# Patient Record
Sex: Female | Born: 1960 | Race: Black or African American | Hispanic: No | Smoking: Never smoker
Health system: Southern US, Community
[De-identification: ages and names within clinical notes are randomized; demographics above are authoritative.]

## PROBLEM LIST (undated history)

## (undated) DIAGNOSIS — Z8619 Personal history of other infectious and parasitic diseases: Secondary | ICD-10-CM

## (undated) DIAGNOSIS — IMO0002 Reserved for concepts with insufficient information to code with codable children: Secondary | ICD-10-CM

## (undated) DIAGNOSIS — K8020 Calculus of gallbladder without cholecystitis without obstruction: Secondary | ICD-10-CM

## (undated) DIAGNOSIS — G43909 Migraine, unspecified, not intractable, without status migrainosus: Secondary | ICD-10-CM

## (undated) DIAGNOSIS — I341 Nonrheumatic mitral (valve) prolapse: Secondary | ICD-10-CM

## (undated) DIAGNOSIS — N979 Female infertility, unspecified: Secondary | ICD-10-CM

## (undated) DIAGNOSIS — R011 Cardiac murmur, unspecified: Secondary | ICD-10-CM

## (undated) DIAGNOSIS — Z87440 Personal history of urinary (tract) infections: Secondary | ICD-10-CM

## (undated) DIAGNOSIS — Z9889 Other specified postprocedural states: Secondary | ICD-10-CM

## (undated) DIAGNOSIS — R932 Abnormal findings on diagnostic imaging of liver and biliary tract: Secondary | ICD-10-CM

## (undated) DIAGNOSIS — O09529 Supervision of elderly multigravida, unspecified trimester: Secondary | ICD-10-CM

## (undated) DIAGNOSIS — R51 Headache: Secondary | ICD-10-CM

## (undated) DIAGNOSIS — D649 Anemia, unspecified: Secondary | ICD-10-CM

## (undated) DIAGNOSIS — N83209 Unspecified ovarian cyst, unspecified side: Secondary | ICD-10-CM

## (undated) DIAGNOSIS — Z8719 Personal history of other diseases of the digestive system: Secondary | ICD-10-CM

## (undated) HISTORY — DX: Calculus of gallbladder without cholecystitis without obstruction: K80.20

## (undated) HISTORY — PX: WISDOM TOOTH EXTRACTION: SHX21

## (undated) HISTORY — DX: Abnormal findings on diagnostic imaging of liver and biliary tract: R93.2

## (undated) HISTORY — DX: Migraine, unspecified, not intractable, without status migrainosus: G43.909

## (undated) HISTORY — DX: Cardiac murmur, unspecified: R01.1

## (undated) HISTORY — DX: Personal history of other diseases of the digestive system: Z87.19

## (undated) HISTORY — DX: Reserved for concepts with insufficient information to code with codable children: IMO0002

## (undated) HISTORY — PX: LAPAROSCOPIC ABLATION RENAL MASS: SUR751

## (undated) HISTORY — DX: Female infertility, unspecified: N97.9

## (undated) HISTORY — DX: Personal history of other infectious and parasitic diseases: Z86.19

## (undated) HISTORY — DX: Nonrheumatic mitral (valve) prolapse: I34.1

## (undated) HISTORY — DX: Unspecified ovarian cyst, unspecified side: N83.209

## (undated) HISTORY — DX: Personal history of urinary (tract) infections: Z87.440

## (undated) HISTORY — DX: Anemia, unspecified: D64.9

## (undated) HISTORY — PX: LAPAROSCOPIC OVARIAN CYSTECTOMY: SUR786

## (undated) HISTORY — DX: Supervision of elderly multigravida, unspecified trimester: O09.529

## (undated) HISTORY — DX: Headache: R51

---

## 1998-06-11 DIAGNOSIS — R87619 Unspecified abnormal cytological findings in specimens from cervix uteri: Secondary | ICD-10-CM | POA: Insufficient documentation

## 1999-06-12 DIAGNOSIS — Z8719 Personal history of other diseases of the digestive system: Secondary | ICD-10-CM

## 1999-06-12 DIAGNOSIS — R87619 Unspecified abnormal cytological findings in specimens from cervix uteri: Secondary | ICD-10-CM

## 1999-06-12 DIAGNOSIS — IMO0002 Reserved for concepts with insufficient information to code with codable children: Secondary | ICD-10-CM

## 1999-06-12 HISTORY — DX: Reserved for concepts with insufficient information to code with codable children: IMO0002

## 1999-06-12 HISTORY — DX: Personal history of other diseases of the digestive system: Z87.19

## 1999-06-12 HISTORY — DX: Unspecified abnormal cytological findings in specimens from cervix uteri: R87.619

## 2003-04-12 DIAGNOSIS — Z87440 Personal history of urinary (tract) infections: Secondary | ICD-10-CM

## 2003-04-12 HISTORY — DX: Personal history of urinary (tract) infections: Z87.440

## 2003-06-28 ENCOUNTER — Emergency Department (HOSPITAL_COMMUNITY): Admission: EM | Admit: 2003-06-28 | Discharge: 2003-06-28 | Payer: Self-pay | Admitting: *Deleted

## 2003-07-03 ENCOUNTER — Emergency Department (HOSPITAL_COMMUNITY): Admission: EM | Admit: 2003-07-03 | Discharge: 2003-07-03 | Payer: Self-pay | Admitting: Podiatry

## 2004-04-17 ENCOUNTER — Ambulatory Visit: Payer: Self-pay | Admitting: Internal Medicine

## 2004-04-18 ENCOUNTER — Ambulatory Visit: Payer: Self-pay

## 2004-08-04 ENCOUNTER — Other Ambulatory Visit: Admission: RE | Admit: 2004-08-04 | Discharge: 2004-08-04 | Payer: Self-pay | Admitting: Obstetrics and Gynecology

## 2004-10-02 ENCOUNTER — Ambulatory Visit (HOSPITAL_COMMUNITY): Admission: RE | Admit: 2004-10-02 | Discharge: 2004-10-02 | Payer: Self-pay | Admitting: Obstetrics and Gynecology

## 2005-02-22 ENCOUNTER — Ambulatory Visit (HOSPITAL_COMMUNITY): Admission: RE | Admit: 2005-02-22 | Discharge: 2005-02-22 | Payer: Self-pay | Admitting: Obstetrics and Gynecology

## 2005-02-27 ENCOUNTER — Inpatient Hospital Stay (HOSPITAL_COMMUNITY): Admission: AD | Admit: 2005-02-27 | Discharge: 2005-03-02 | Payer: Self-pay | Admitting: Obstetrics and Gynecology

## 2005-02-27 ENCOUNTER — Encounter (INDEPENDENT_AMBULATORY_CARE_PROVIDER_SITE_OTHER): Payer: Self-pay | Admitting: Specialist

## 2005-05-23 ENCOUNTER — Ambulatory Visit: Payer: Self-pay | Admitting: Internal Medicine

## 2005-06-22 ENCOUNTER — Ambulatory Visit: Payer: Self-pay | Admitting: Family Medicine

## 2005-07-05 ENCOUNTER — Ambulatory Visit: Payer: Self-pay | Admitting: Internal Medicine

## 2005-07-31 ENCOUNTER — Ambulatory Visit: Payer: Self-pay | Admitting: Internal Medicine

## 2005-10-08 ENCOUNTER — Ambulatory Visit: Payer: Self-pay | Admitting: Internal Medicine

## 2005-10-23 ENCOUNTER — Emergency Department (HOSPITAL_COMMUNITY): Admission: EM | Admit: 2005-10-23 | Discharge: 2005-10-23 | Payer: Self-pay | Admitting: *Deleted

## 2006-02-25 ENCOUNTER — Other Ambulatory Visit: Admission: RE | Admit: 2006-02-25 | Discharge: 2006-02-25 | Payer: Self-pay | Admitting: Obstetrics and Gynecology

## 2006-03-11 ENCOUNTER — Encounter: Admission: RE | Admit: 2006-03-11 | Discharge: 2006-03-11 | Payer: Self-pay | Admitting: Obstetrics and Gynecology

## 2006-03-18 ENCOUNTER — Encounter: Admission: RE | Admit: 2006-03-18 | Discharge: 2006-03-18 | Payer: Self-pay | Admitting: Obstetrics and Gynecology

## 2006-06-11 DIAGNOSIS — IMO0002 Reserved for concepts with insufficient information to code with codable children: Secondary | ICD-10-CM

## 2006-06-11 HISTORY — DX: Reserved for concepts with insufficient information to code with codable children: IMO0002

## 2006-09-08 ENCOUNTER — Emergency Department (HOSPITAL_COMMUNITY): Admission: EM | Admit: 2006-09-08 | Discharge: 2006-09-08 | Payer: Self-pay | Admitting: Family Medicine

## 2006-09-10 ENCOUNTER — Emergency Department (HOSPITAL_COMMUNITY): Admission: EM | Admit: 2006-09-10 | Discharge: 2006-09-10 | Payer: Self-pay | Admitting: Emergency Medicine

## 2006-09-10 ENCOUNTER — Ambulatory Visit: Payer: Self-pay | Admitting: Internal Medicine

## 2006-09-18 ENCOUNTER — Ambulatory Visit: Payer: Self-pay | Admitting: Internal Medicine

## 2006-09-18 LAB — CONVERTED CEMR LAB
ALT: 14 units/L (ref 0–40)
AST: 15 units/L (ref 0–37)
Albumin: 3.7 g/dL (ref 3.5–5.2)
Basophils Absolute: 0 10*3/uL (ref 0.0–0.1)
Chloride: 104 meq/L (ref 96–112)
Creatinine, Ser: 0.5 mg/dL (ref 0.4–1.2)
Eosinophils Relative: 1.5 % (ref 0.0–5.0)
HCT: 35 % — ABNORMAL LOW (ref 36.0–46.0)
MCHC: 35.1 g/dL (ref 30.0–36.0)
Neutrophils Relative %: 51.4 % (ref 43.0–77.0)
RBC: 3.85 M/uL — ABNORMAL LOW (ref 3.87–5.11)
RDW: 13.9 % (ref 11.5–14.6)
Sodium: 139 meq/L (ref 135–145)
Total Bilirubin: 0.5 mg/dL (ref 0.3–1.2)
WBC: 5.3 10*3/uL (ref 4.5–10.5)

## 2006-12-17 ENCOUNTER — Ambulatory Visit (HOSPITAL_COMMUNITY): Admission: RE | Admit: 2006-12-17 | Discharge: 2006-12-17 | Payer: Self-pay | Admitting: Obstetrics and Gynecology

## 2006-12-18 ENCOUNTER — Ambulatory Visit: Payer: Self-pay | Admitting: Internal Medicine

## 2006-12-18 ENCOUNTER — Encounter: Payer: Self-pay | Admitting: Internal Medicine

## 2006-12-18 DIAGNOSIS — Z8669 Personal history of other diseases of the nervous system and sense organs: Secondary | ICD-10-CM

## 2006-12-18 DIAGNOSIS — R51 Headache: Secondary | ICD-10-CM

## 2006-12-18 HISTORY — DX: Headache: R51

## 2007-03-13 ENCOUNTER — Ambulatory Visit (HOSPITAL_COMMUNITY): Admission: RE | Admit: 2007-03-13 | Discharge: 2007-03-13 | Payer: Self-pay | Admitting: Obstetrics and Gynecology

## 2007-04-03 ENCOUNTER — Emergency Department (HOSPITAL_COMMUNITY): Admission: EM | Admit: 2007-04-03 | Discharge: 2007-04-03 | Payer: Self-pay | Admitting: Family Medicine

## 2007-08-05 ENCOUNTER — Encounter (INDEPENDENT_AMBULATORY_CARE_PROVIDER_SITE_OTHER): Payer: Self-pay | Admitting: Obstetrics and Gynecology

## 2007-08-05 ENCOUNTER — Inpatient Hospital Stay (HOSPITAL_COMMUNITY): Admission: RE | Admit: 2007-08-05 | Discharge: 2007-08-09 | Payer: Self-pay | Admitting: Obstetrics and Gynecology

## 2008-01-29 ENCOUNTER — Emergency Department (HOSPITAL_BASED_OUTPATIENT_CLINIC_OR_DEPARTMENT_OTHER): Admission: EM | Admit: 2008-01-29 | Discharge: 2008-01-29 | Payer: Self-pay | Admitting: Emergency Medicine

## 2008-02-02 ENCOUNTER — Ambulatory Visit: Payer: Self-pay | Admitting: Gastroenterology

## 2008-02-02 DIAGNOSIS — R1013 Epigastric pain: Secondary | ICD-10-CM

## 2008-02-02 DIAGNOSIS — R932 Abnormal findings on diagnostic imaging of liver and biliary tract: Secondary | ICD-10-CM | POA: Insufficient documentation

## 2008-02-02 HISTORY — DX: Abnormal findings on diagnostic imaging of liver and biliary tract: R93.2

## 2008-02-03 LAB — CONVERTED CEMR LAB
AST: 23 units/L (ref 0–37)
Alkaline Phosphatase: 95 units/L (ref 39–117)
Basophils Absolute: 0.1 10*3/uL (ref 0.0–0.1)
Bilirubin, Direct: 0.1 mg/dL (ref 0.0–0.3)
Chloride: 107 meq/L (ref 96–112)
Eosinophils Absolute: 0.1 10*3/uL (ref 0.0–0.7)
Eosinophils Relative: 1.3 % (ref 0.0–5.0)
GFR calc Af Amer: 117 mL/min
GFR calc non Af Amer: 97 mL/min
MCHC: 34.1 g/dL (ref 30.0–36.0)
MCV: 93.2 fL (ref 78.0–100.0)
Neutrophils Relative %: 57 % (ref 43.0–77.0)
Platelets: 340 10*3/uL (ref 150–400)
Potassium: 3.4 meq/L — ABNORMAL LOW (ref 3.5–5.1)
RDW: 14 % (ref 11.5–14.6)
Sodium: 143 meq/L (ref 135–145)
Total Bilirubin: 0.6 mg/dL (ref 0.3–1.2)
WBC: 6.2 10*3/uL (ref 4.5–10.5)

## 2008-04-16 ENCOUNTER — Emergency Department (HOSPITAL_BASED_OUTPATIENT_CLINIC_OR_DEPARTMENT_OTHER): Admission: EM | Admit: 2008-04-16 | Discharge: 2008-04-16 | Payer: Self-pay | Admitting: Emergency Medicine

## 2009-05-31 ENCOUNTER — Ambulatory Visit: Payer: Self-pay | Admitting: Internal Medicine

## 2009-06-13 ENCOUNTER — Ambulatory Visit: Payer: Self-pay | Admitting: Internal Medicine

## 2009-06-13 LAB — CONVERTED CEMR LAB
Basophils Absolute: 0.1 10*3/uL (ref 0.0–0.1)
Cholesterol: 164 mg/dL (ref 0–200)
Eosinophils Relative: 2.3 % (ref 0.0–5.0)
HCT: 33.8 % — ABNORMAL LOW (ref 36.0–46.0)
Hemoglobin: 11.3 g/dL — ABNORMAL LOW (ref 12.0–15.0)
Lymphs Abs: 1.9 10*3/uL (ref 0.7–4.0)
MCV: 94.8 fL (ref 78.0–100.0)
Monocytes Absolute: 0.5 10*3/uL (ref 0.1–1.0)
Neutro Abs: 2.8 10*3/uL (ref 1.4–7.7)
Platelets: 248 10*3/uL (ref 150.0–400.0)
RDW: 14.3 % (ref 11.5–14.6)

## 2009-06-20 ENCOUNTER — Ambulatory Visit: Payer: Self-pay | Admitting: Internal Medicine

## 2009-06-23 ENCOUNTER — Ambulatory Visit: Payer: Self-pay | Admitting: Internal Medicine

## 2009-07-27 ENCOUNTER — Ambulatory Visit: Payer: Self-pay | Admitting: Family Medicine

## 2009-07-30 ENCOUNTER — Ambulatory Visit: Payer: Self-pay | Admitting: Diagnostic Radiology

## 2009-07-30 ENCOUNTER — Emergency Department (HOSPITAL_BASED_OUTPATIENT_CLINIC_OR_DEPARTMENT_OTHER): Admission: EM | Admit: 2009-07-30 | Discharge: 2009-07-30 | Payer: Self-pay | Admitting: Emergency Medicine

## 2009-09-04 ENCOUNTER — Emergency Department (HOSPITAL_BASED_OUTPATIENT_CLINIC_OR_DEPARTMENT_OTHER): Admission: EM | Admit: 2009-09-04 | Discharge: 2009-09-04 | Payer: Self-pay | Admitting: Emergency Medicine

## 2009-09-12 ENCOUNTER — Emergency Department (HOSPITAL_BASED_OUTPATIENT_CLINIC_OR_DEPARTMENT_OTHER): Admission: EM | Admit: 2009-09-12 | Discharge: 2009-09-13 | Payer: Self-pay | Admitting: Emergency Medicine

## 2009-11-09 ENCOUNTER — Encounter: Admission: RE | Admit: 2009-11-09 | Discharge: 2009-11-09 | Payer: Self-pay | Admitting: Obstetrics and Gynecology

## 2010-03-20 ENCOUNTER — Emergency Department (HOSPITAL_BASED_OUTPATIENT_CLINIC_OR_DEPARTMENT_OTHER): Admission: EM | Admit: 2010-03-20 | Discharge: 2010-03-20 | Payer: Self-pay | Admitting: Emergency Medicine

## 2010-03-20 ENCOUNTER — Encounter: Payer: Self-pay | Admitting: Cardiovascular Disease

## 2010-03-20 ENCOUNTER — Ambulatory Visit: Payer: Self-pay | Admitting: Diagnostic Radiology

## 2010-03-21 ENCOUNTER — Telehealth: Payer: Self-pay | Admitting: Internal Medicine

## 2010-03-21 DIAGNOSIS — R079 Chest pain, unspecified: Secondary | ICD-10-CM | POA: Insufficient documentation

## 2010-03-23 DIAGNOSIS — K8020 Calculus of gallbladder without cholecystitis without obstruction: Secondary | ICD-10-CM

## 2010-03-23 DIAGNOSIS — D649 Anemia, unspecified: Secondary | ICD-10-CM

## 2010-03-23 DIAGNOSIS — N83209 Unspecified ovarian cyst, unspecified side: Secondary | ICD-10-CM

## 2010-03-23 HISTORY — DX: Anemia, unspecified: D64.9

## 2010-03-23 HISTORY — DX: Calculus of gallbladder without cholecystitis without obstruction: K80.20

## 2010-03-23 HISTORY — DX: Unspecified ovarian cyst, unspecified side: N83.209

## 2010-03-24 ENCOUNTER — Ambulatory Visit: Payer: Self-pay | Admitting: Cardiology

## 2010-04-07 ENCOUNTER — Ambulatory Visit (HOSPITAL_COMMUNITY): Admission: RE | Admit: 2010-04-07 | Discharge: 2010-04-07 | Payer: Self-pay | Admitting: Cardiology

## 2010-04-07 ENCOUNTER — Encounter: Payer: Self-pay | Admitting: Cardiology

## 2010-04-07 ENCOUNTER — Ambulatory Visit: Payer: Self-pay | Admitting: Internal Medicine

## 2010-04-07 ENCOUNTER — Ambulatory Visit: Payer: Self-pay

## 2010-07-01 ENCOUNTER — Encounter: Payer: Self-pay | Admitting: Obstetrics and Gynecology

## 2010-07-02 ENCOUNTER — Encounter: Payer: Self-pay | Admitting: Obstetrics and Gynecology

## 2010-07-03 ENCOUNTER — Encounter: Payer: Self-pay | Admitting: Obstetrics and Gynecology

## 2010-07-11 NOTE — Assessment & Plan Note (Signed)
Summary: COUGH, CONGESTION // RS//PT [facility] WANTED LATER APPT/CJR   Vital Signs:  Patient profile:   50 year old female Temp:     99.8 degrees F oral BP sitting:   100 / 70  (left arm) Cuff size:   regular  Vitals Entered By: Sid Falcon LPN (July 27, 2009 2:21 PM) CC: Cough, congestion, body aches, chills X 2-3 days      History of Present Illness: Acute visit. Onset 3 days ago flulike illness with dry cough, body aches, chills and fever. Sore throat. Denies any nausea, vomiting, or diarrhea. TheraFlu without much relief. 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  Allergies: 1)  ! Pcn 2)  ! Imitrex 3)  ! * Latex  Past History:  Past Medical History: Last updated: 06/13/2009 Anemia GERD Headache PMH reviewed for relevance  Review of Systems      See HPI  Physical Exam  General:  Well-developed,well-nourished,in no acute distress; alert,appropriate and cooperative throughout examination Ears:  External ear exam shows no significant lesions or deformities.  Otoscopic examination reveals clear canals, tympanic membranes are intact bilaterally without bulging, retraction, inflammation or discharge. Hearing is grossly normal bilaterally. Mouth:  Oral mucosa and oropharynx without lesions or exudates.  Teeth in good repair. Neck:  No deformities, masses, or tenderness noted. Lungs:  Normal respiratory effort, chest expands symmetrically. Lungs are clear to auscultation, no crackles or wheezes. Heart:  Normal rate and regular rhythm. S1 and S2 normal without gallop, murmur, click, rub or other extra sounds. Skin:  no rash. Cervical Nodes:  No lymphadenopathy noted   Impression & Recommendations:  Problem # 1:  VIRAL INFECTION (ICD-079.99)  Her updated medication list for this problem includes:    Hydrocodone-homatropine 5-1.5 Mg/35ml Syrp (Hydrocodone-homatropine) ..... One tsp by mouth q 4-6 hours as needed cough  Complete Medication List: 1)   Hydrocodone-homatropine 5-1.5 Mg/80ml Syrp (Hydrocodone-homatropine) .... One tsp by mouth q 4-6 hours as needed cough  Patient Instructions: 1)  Get plenty of rest, drink lots of clear liquids, and use Tylenol or Ibuprofen for fever and comfort. Return in 7-10 days if you're not better: sooner if you'er feeling worse.  Prescriptions: HYDROCODONE-HOMATROPINE 5-1.5 MG/5ML SYRP (HYDROCODONE-HOMATROPINE) one tsp by mouth q 4-6 hours as needed cough  #120 ml x 0   Entered and Authorized by:   Evelena Peat MD   Signed by:   Evelena Peat MD on 07/27/2009   Method used:   Print then Give to Patient   RxID:   [id number]

## 2010-07-11 NOTE — Assessment & Plan Note (Signed)
Summary: cpx/pt coming in fasting/cjr   Vital Signs:  Patient profile:   50 year old female Weight:      162 pounds BMI:     31.75 BP sitting:   102 / 70  (left arm) Cuff size:   regular  Vitals Entered By: [name], RN (June 13, 2009 1:33 PM) CC: CPX, sees gyn. C/o headaches, neck and back pain. Is Patient Diabetic? No   Primary Care Provider:  Eleonore Cox, [name]  CC:  CPX, sees gyn. C/o headaches, and neck and back pain.[name]  History of Present Illness: 50 year old patient who is seen today for a comprehensive evaluation.  She is followed annually by gynecology for main complaint is chronic daily headaches.  These began approximately 2 years ago postpartum and she describes severe headaches.  These are a severe pressure sensation that occur in both the frontal and occipital areas. These  are associated  with nausea and some dizziness, but no light sensitivity.  She states that her 10 years ago, she was felt to have migraine headaches that  were associated with a visual aura.  She has been taking anti-inflammatory medications with no significant benefit  Allergies: 1)  ! Pcn 2)  ! Imitrex 3)  ! * Latex  Past History:  Past Medical History: Anemia GERD Headache  Past Surgical History: hernia surgery G2P2A0  Family History: Reviewed history from 02/02/2008 and no changes required. no gallbladder disease in family  father:  25 DJD,  mother: 55 Htn   one sister- scoliosis  Social History: Reviewed history from 02/02/2008 and no changes required. married, 2 children, works as a Futures trader, nonsmoker, her son's are age 11 and 6 months.  Review of Systems       The patient complains of headaches.  The patient denies anorexia, fever, weight loss, weight gain, vision loss, decreased hearing, hoarseness, chest pain, syncope, dyspnea on exertion, peripheral edema, prolonged cough, hemoptysis, abdominal pain, melena, hematochezia, severe indigestion/heartburn,  hematuria, incontinence, genital sores, muscle weakness, suspicious skin lesions, transient blindness, difficulty walking, depression, unusual weight change, abnormal bleeding, enlarged lymph nodes, angioedema, and breast masses.    Physical Exam  General:  overweight-appearing.  low-normal blood pressureoverweight-appearing.   Head:  Normocephalic and atraumatic without obvious abnormalities. No apparent alopecia or balding. Eyes:  No corneal or conjunctival inflammation noted. EOMI. Perrla. Funduscopic exam benign, without hemorrhages, exudates or papilledema. Vision grossly normal. Ears:  External ear exam shows no significant lesions or deformities.  Otoscopic examination reveals clear canals, tympanic membranes are intact bilaterally without bulging, retraction, inflammation or discharge. Hearing is grossly normal bilaterally. Nose:  External nasal examination shows no deformity or inflammation. Nasal mucosa are pink and moist without lesions or exudates. Mouth:  Oral mucosa and oropharynx without lesions or exudates.  Teeth in good repair. Neck:  No deformities, masses, or tenderness noted. Chest Wall:  No deformities, masses, or tenderness noted. Breasts:  No mass, nodules, thickening, tenderness, bulging, retraction, inflamation, nipple discharge or skin changes noted.   Lungs:  Normal respiratory effort, chest expands symmetrically. Lungs are clear to auscultation, no crackles or wheezes. Heart:  Normal rate and regular rhythm. S1 and S2 normal without gallop, murmur, click, rub or other extra sounds. Abdomen:  Bowel sounds positive,abdomen soft and non-tender without masses, organomegaly or hernias noted. Msk:  No deformity or scoliosis noted of thoracic or lumbar spine.   Pulses:  R and L carotid,radial,femoral,dorsalis pedis and posterior tibial pulses are full and equal  bilaterally Extremities:  No clubbing, cyanosis, edema, or deformity noted with normal full range of motion of all  joints.   Neurologic:  No cranial nerve deficits noted. Station and gait are normal. Plantar reflexes are down-going bilaterally. DTRs are symmetrical throughout. Sensory, motor and coordinative functions appear intact. Skin:  Intact without suspicious lesions or rashes Cervical Nodes:  No lymphadenopathy noted Axillary Nodes:  No palpable lymphadenopathy Inguinal Nodes:  No significant adenopathy Psych:  Cognition and judgment appear intact. Alert and cooperative with normal attention span and concentration. No apparent delusions, illusions, hallucinations   Impression & Recommendations:  Problem # 1:  HEADACHE (ICD-784.0)  Other Orders: Venipuncture (44010) TLB-CBC Platelet - w/Differential (85025-CBCD) TLB-Cholesterol, Total (82465-CHO) Headache Clinic Referral (Headache)  Patient Instructions: 1)  Please schedule a follow-up appointment in 1 year. 2)  It is important that you exercise regularly at least 20 minutes 5 times a week. If you develop chest pain, have severe difficulty breathing, or feel very tired , stop exercising immediately and seek medical attention. 3)  headache clinic referral

## 2010-07-11 NOTE — Progress Notes (Signed)
Summary: Cardio Referral  Phone Note Call from Patient Call back at Home Phone [phone]   Caller: Patient Summary of Call: Patient called this morning stating that she went to the [facility] ER in [facility] last night for chest pain. She is wanting a referral to see a cardiologist. I offered her a appointment to talk about this with you but she said she sees no reason bc all she needs is the referral. She said she had an EKG and echo done in the ER. She would like for you to review her records from last night and let her know what you think. Please advise.  Initial call taken by: Harold Barban,  March 21, 2010 9:11 AM  Follow-up for Phone Call        do I need to try and get this info today?  Follow-up by: Duard Brady LPN,  March 21, 2010 10:02 AM  Additional Follow-up for Phone Call Additional follow up Details #1::        OK to refer to cardiology Additional Follow-up by: Gordy Savers  MD,  March 21, 2010 12:45 PM  New Problems: CHEST PAIN UNSPECIFIED (ICD-786.50)   Additional Follow-up for Phone Call Additional follow up Details #2::    spoke with pt - per Dr. Amador Cunas - ok to refer to cardio for chest pain. Pt ware terri will call once appt set. KIK Follow-up by: Duard Brady LPN,  March 21, 2010 2:13 PM  New Problems: CHEST PAIN UNSPECIFIED (ICD-786.50)

## 2010-07-11 NOTE — Assessment & Plan Note (Signed)
Summary: np6/ chestpain, pt went on er on last night.pt has bcbs/ gd   Primary Provider:  [name], [name]  CC:  chest pain sob and dizziness.  History of Present Illness: 50 year old female for evaluation of chest pain. Seen recently in the emergency room for chest pain. Cardiac markers, d-dimer, chest x-ray, liver functions normal. Hemoglobin 11.4. Patient states that she has had intermittent costochondritis in the past. On October 11 she developed chest tightness. Her symptoms persisted despite a heating pad and Motrin. She was seen in the emergency room as described above. However her symptoms have been continuous for the past 5 days and we were asked to further evaluate. Her symptoms are nonexertional. They are not pleuritic or related to food. She did have nausea on October 11 but no diaphoresis. There is mild shortness of breath. The pain increases with walking. There is no orthopnea, PND or pedal edema. There is no palpitations or syncope.    Current Medications (verified): 1)  Hydrocodone-Homatropine 5-1.5 Mg/3ml Syrp (Hydrocodone-Homatropine) .... One Tsp By Mouth Q 4-6 Hours As Needed Cough 2)  Multivitamins   Tabs (Multiple Vitamin) .Marland Kitchen.. 1  Tab By Mouth When Pt Rembers  Allergies: 1)  ! Pcn 2)  ! Imitrex 3)  ! * Latex  Past History:  Past Medical History: ANEMIA  OVARIAN CYST  GALLSTONES  H/O murmur H/O costochondritis  Past Surgical History: G2P2A0 Lap surgery for ovarian cyst  Family History: Reviewed history from 06/13/2009 and no changes required. no gallbladder disease in family Father with MI at age 91 father:  53 DJD,  mother: 17 Htn  one sister- scoliosis  Social History: Reviewed history from 02/02/2008 and no changes required. 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 Alcohol Use - no  Review of Systems       no fevers or chills, productive cough, hemoptysis, dysphasia, odynophagia, melena,  hematochezia, dysuria, hematuria, rash, seizure activity, orthopnea, PND, pedal edema, claudication. Remaining systems are negative.   Vital Signs:  Patient profile:   50 year old female Height:      60 inches Weight:      158 pounds BMI:     30.97 Pulse rate:   77 / minute Resp:     14 per minute BP sitting:   130 / 82  (left arm)  Vitals Entered By: Kem Parkinson (March 24, 2010 4:15 PM)  Physical Exam  General:  Well developed/well nourished in NAD Skin warm/dry Patient not depressed No peripheral clubbing Back-normal HEENT-normal/normal eyelids Neck supple/normal carotid upstroke bilaterally; no bruits; no JVD; no thyromegaly chest - CTA/ normal expansion CV - RRR/normal S1 and S2; no murmurs, rubs or gallops;  PMI nondisplaced Abdomen -NT/ND, no HSM, no mass, + bowel sounds, no bruit; No right upper quadrant tenderness. 2+ femoral pulses, no bruits Ext-no edema, chords, 2+ DP Neuro-grossly nonfocal     Impression & Recommendations:  Problem # 1:  CHEST PAIN UNSPECIFIED (ICD-786.50) Symptoms extremely atypical. May be musculoskeletal. Doubt pericarditis but will check echocardiogram. Continue nonsteroidal. Orders: Echocardiogram (Echo)  Problem # 2:  ANEMIA (ICD-285.9) Followup with primary care for this issue.  Other Orders: EKG w/ Interpretation (93000)  Patient Instructions: 1)  Your physician recommends that you schedule a follow-up appointment in: as needed with Dr. Jens Som 2)  Your physician recommends that you continue on your current medications as directed. Please refer to the Current Medication list given to you today. 3)  Your  physician has requested that you have an echocardiogram.  Echocardiography is a painless test that uses sound waves to create images of your heart. It provides your doctor with information about the size and shape of your heart and how well your heart's chambers and valves are working.  This procedure takes approximately  one hour. There are no restrictions for this procedure.

## 2010-07-11 NOTE — Assessment & Plan Note (Signed)
Summary: TB SKIN TEST/CCM  Nurse Visit   Allergies: 1)  ! Pcn 2)  ! Imitrex 3)  ! * Latex  Immunizations Administered:  PPD Skin Test:    Vaccine Type: PPD    Site: right forearm    Mfr: Sanofi Pasteur    Dose: 0.1 ml    Route: ID    Given by: [name], RN    Exp. Date: 08/17/2011    Lot #: [id number]  Orders Added: 1)  TB Skin Test [86580] 2)  Admin 1st Vaccine [phone]

## 2010-07-11 NOTE — Assessment & Plan Note (Signed)
Summary: TB READING/RCD  Nurse Visit   Allergies: 1)  ! Pcn 2)  ! Imitrex 3)  ! * Latex  PPD Results    Date of reading: 06/23/2009    Results: < 5mm    Interpretation: negative

## 2010-07-11 NOTE — Letter (Signed)
Summary: [facility]: Physician Documentation Sheet  [facility]: Physician Documentation Sheet   Imported By: Earl Many 03/23/2010 16:10:30  _____________________________________________________________________  External Attachment:    Type:   Image     Comment:   External Document

## 2010-08-24 LAB — CBC
HCT: 33.9 % — ABNORMAL LOW (ref 36.0–46.0)
MCH: 31.2 pg (ref 26.0–34.0)
MCV: 92.9 fL (ref 78.0–100.0)
Platelets: 283 10*3/uL (ref 150–400)
RDW: 13.9 % (ref 11.5–15.5)
WBC: 6 10*3/uL (ref 4.0–10.5)

## 2010-08-24 LAB — DIFFERENTIAL
Basophils Absolute: 0.1 10*3/uL (ref 0.0–0.1)
Basophils Relative: 2 % — ABNORMAL HIGH (ref 0–1)
Lymphocytes Relative: 29 % (ref 12–46)
Neutro Abs: 3.6 10*3/uL (ref 1.7–7.7)
Neutrophils Relative %: 59 % (ref 43–77)

## 2010-08-24 LAB — COMPREHENSIVE METABOLIC PANEL
Alkaline Phosphatase: 45 U/L (ref 39–117)
BUN: 15 mg/dL (ref 6–23)
Creatinine, Ser: 0.7 mg/dL (ref 0.4–1.2)
Glucose, Bld: 123 mg/dL — ABNORMAL HIGH (ref 70–99)
Potassium: 3.4 mEq/L — ABNORMAL LOW (ref 3.5–5.1)
Total Bilirubin: 0.6 mg/dL (ref 0.3–1.2)
Total Protein: 7.8 g/dL (ref 6.0–8.3)

## 2010-08-24 LAB — HEPATIC FUNCTION PANEL
ALT: 8 U/L (ref 0–35)
AST: 27 U/L (ref 0–37)
Bilirubin, Direct: 0 mg/dL (ref 0.0–0.3)
Indirect Bilirubin: 0.6 mg/dL (ref 0.3–0.9)
Total Bilirubin: 0.6 mg/dL (ref 0.3–1.2)

## 2010-08-24 LAB — POCT CARDIAC MARKERS
CKMB, poc: 1 ng/mL (ref 1.0–8.0)
Myoglobin, poc: 52.7 ng/mL (ref 12–200)
Troponin i, poc: <0.05 ng/mL (ref 0.00–0.09)

## 2010-08-30 LAB — URINALYSIS, ROUTINE W REFLEX MICROSCOPIC
Leukocytes, UA: NEGATIVE
Nitrite: NEGATIVE
Specific Gravity, Urine: 1.012 (ref 1.005–1.030)
pH: 5.5 (ref 5.0–8.0)

## 2010-08-30 LAB — URINE MICROSCOPIC-ADD ON

## 2010-08-30 LAB — PREGNANCY, URINE: Preg Test, Ur: NEGATIVE

## 2010-10-04 ENCOUNTER — Encounter: Payer: Self-pay | Admitting: Internal Medicine

## 2010-10-06 ENCOUNTER — Ambulatory Visit: Payer: Self-pay | Admitting: Internal Medicine

## 2010-10-24 NOTE — Discharge Summary (Signed)
NAMEJOSANNA, [name] NO.:  [id number]   MEDICAL RECORD NO.:  [id number]          PATIENT TYPE:  INP   LOCATION:  9137                          FACILITY:  [facility]   PHYSICIAN:  Osborn Coho, [name]   DATE OF BIRTH:  1960/08/03   DATE OF ADMISSION:  08/05/2007  DATE OF DISCHARGE:  08/09/2007                               DISCHARGE SUMMARY   ADMISSION DIAGNOSES:  1. Intrauterine pregnancy at 85 and 0/7 weeks  2. Previous cesarean section.   DISCHARGE DIAGNOSES:  1. Intrauterine pregnancy at 39 weeks, delivered.  2. Repeat low transverse cesarean section.  3. Urinary retention.  4. Postoperative anemia, asymptomatic.   PROCEDURES:  1. Repeat low transverse cesarean section.  2. Foley catheterization, in-and-out cath.   HOSPITAL COURSE:  [name] is a 50 year old gravida 2, para 1-0-0-1,  admitted at 61 and 0/7 weeks for elective repeat cesarean section.  Patient's pregnancy has been remarkable for:  1. Previous cesarean section.  2. Advanced maternal age.  3. History of mitral valve prolapse.  4. PENICILLIN ALLERGY.   Patient was admitted through Same Day OR.  The patient underwent a  repeat elective cesarean section without complications.  Patient  delivered a viable female infant named [name], 6 pounds 1 ounce, with  Apgars of 8 and 10 at 1 and 5 minutes respectively.  Estimated blood  loss 900 mL.   Later in the evening on postoperative day #0 patient had soaked her  abdominal dressing which was removed and no active bleeding noted,  dressing was replaced and no bleeding was noted on the abdominal  dressing.   On postoperative day #1, patient's hemoglobin was noted to be 7.5.  Patient with slight dizziness and otherwise no complaints with the  exception of unable to void after Foley catheter removed.  Patient  declined blood transfusion at that time.  Patient's orthostatic vital  signs on postoperative day #1 were within normal limits.  Urinary  retention continued to persist despite in-and-out cath and scheduled  voiding; therefore, Foley was replaced for an approximate 12 hour period  of time.  On postoperative day #2, repeat hemoglobin 7.7, the patient  remained asymptomatic.  Foley removed in the morning of postoperative  day #3 and patient did void spontaneously throughout the day with some  continued issues of urinary pressure.  On postoperative day #4 patient  with continued bladder complaints, however residual noted to be 357 mL  after spontaneous void and patient had voided spontaneously 1125 mL in  an 8 hour period prior to discharge.  The patient had been started on  antibiotics for UTI prevention due to frequent catheterizations and  urinary retention.  Patient was otherwise without complaints on  postoperative day #4.  Patient remained afebrile and was tolerating  liquids and solid without difficulty and ambulating without difficulty.  Patient's pain was well controlled.  Patient was breastfeeding without  problems.  Patient was given option of discharge with a Foley catheter  in place as well as continued observation and scheduled voiding.  The  patient was also given option of use  of Urecholine to stimulate bladder  emptying; however, patient would have to pump and dump her breast milk  due to the uncertain safety of this particular medication and patient  elected to be discharged with observation and scheduled voiding at the  present time.  Patient was given careful discharge instructions  regarding urinary retention symptoms.   Patient elects to begin OCPs for contraception, however, she plans on  initiating those at her 6 week postpartum visit.   DISCHARGE INSTRUCTIONS:  Per [facility] handout.   DISCHARGE MEDICATIONS:  1. Motrin 600 mg every 6 hours p.r.n. pain.  2. Tylox 1-2 tabs every 4 to 6 hours as needed for pain.  3. Macrobid 1 capsule daily x7 days.  4. Tandem Plus iron supplement, one tab  p.o. b.i.d.   DISCHARGE FOLLOWUP:  Will occur at [facility] in 6 weeks.      [name], CNM      Osborn Coho, [name]  Electronically Signed    NOS/MEDQ  D:  08/09/2007  T:  08/10/2007  Job:  [id number]

## 2010-10-24 NOTE — H&P (Signed)
NAMEYULIETH, CARRENDER NO.:  [id number]   MEDICAL RECORD NO.:  [id number]          PATIENT TYPE:  INP   LOCATION:  9137                          FACILITY:  [facility]   PHYSICIAN:  Osborn Coho, [name]   DATE OF BIRTH:  [birth year]   DATE OF ADMISSION:  08/05/2007  DATE OF DISCHARGE:                              HISTORY & PHYSICAL   This is a 50 year old, gravida 2, para 1-0-0-1 at 39-0/7 weeks who  presents for repeat C-section. She reports positive fetal movement. The  pregnancy has been followed by Dr. Su Hilt and remarkable for:  1. AMA.  2. History of mitral valve prolapse.  3. PENICILLIN allergy.  4. Previous C-section.   ALLERGIES:  PENICILLIN and IMITREX.   OB HISTORY:  Remarkable for primary low transverse cesarean C-section in  2006 of a female infant at [redacted] weeks gestation weighing 5 pounds 9 ounces  for failure to descend.   MEDICAL HISTORY:  Remarkable for anemia, history of abnormal Pap in the  past with repeats that were normal. Childhood varicella, history of past  infertility, history of heart murmur.  History of ulcer in 2001 and  constipation and headaches.   SURGICAL HISTORY:  Remarkable for a laparoscopic surgery in 2004, wisdom  teeth and a C-section in 2006.   FAMILY HISTORY:  Remarkable for mother and aunt with hypertension and  varicosities, aunt with asthma.  Aunt with diabetes, grandmother with  stroke.   GENETIC HISTORY:  Remarkable for the patient's age of 50 and history of  mitral valve prolapse.   SOCIAL HISTORY:  The patient is married to Jearld Fenton who is  involved and supportive.  She is of the [employer]. She denies any  alcohol, tobacco or drug use.   PRENATAL LABS:  Hemoglobin 11.3, platelets 369.  Blood type O+, antibody  screen negative, sickle cell negative, RPR nonreactive, rubella immune.  Hepatitis negative.  HIV negative.  Cystic fibrosis negative, GC and  chlamydia both negative.   HISTORY OF CURRENT  PREGNANCY:  The patient entered care at [redacted] weeks  gestation.  She had a first trimester screen that was normal and  declined amniocentesis. An anatomy scan at 18 weeks was normal.  She had  an ultrasound in the third trimester for growth secondary to low weight  gain and she presents today for elective repeat C-section.   OBJECTIVE:  VITAL SIGNS:  Stable, afebrile.  HEENT:  Within normal limits. Thyroid normal, not enlarged.  CHEST:  Clear to auscultation.  HEART:  Regular rate and rhythm. No murmur audible today.  ABDOMEN:  Gravid at 39 cm, vertex [place].  Fetal heart rate of 150.  CERVICAL:  Exam deferred.  EXTREMITIES:  Within normal limits.   ASSESSMENT:  1. Intrauterine pregnancy at 39 weeks.  2. Previous C-section.  3. Desires repeat C-section.   PLAN:  Admit to OR per Dr. Su Hilt and further orders to follow.      Marie L. Williams, C.N.M.      Osborn Coho, [name]  Electronically Signed    MLW/MEDQ  D:  08/05/2007  T:  08/05/2007  Job:  [id number]

## 2010-10-24 NOTE — Op Note (Signed)
NAMEAMAIA, LAVALLIE NO.:  [id number]   MEDICAL RECORD NO.:  [id number]          PATIENT TYPE:  INP   LOCATION:  9137                          FACILITY:  [facility]   PHYSICIAN:  Osborn Coho, [name]   DATE OF BIRTH:  1960-10-15   DATE OF PROCEDURE:  08/05/2007  DATE OF DISCHARGE:                               OPERATIVE REPORT   PREOPERATIVE DIAGNOSIS:  Repeat C-section.   POSTOPERATIVE DIAGNOSIS:  Repeat C-section.   PROCEDURE:  Repeat C-section.   SURGEON:  Osborn Coho, [name]   ASSISTANT:  Wynelle Bourgeois, C.N.M.   ANESTHESIA:  Spinal.   FINDINGS:  Live female infant with Apgars of 8 at one minute and 10 at  five minutes, [place], weighing 6 pounds 1 ounce.  Normal-appearing  bilateral ovaries and fallopian tubes.   FLUIDS:  3000 mL.   URINE OUTPUT:  125 mL.   ESTIMATED BLOOD LOSS:  900 mL.   SPECIMEN:  Placenta.   DISPOSITION OF SPECIMEN:  Placenta sent to pathology.   COMPLICATIONS:  None.   PROCEDURE:  The patient is taken to the operating room after risks,  benefits, alternatives discussed with the patient.  The patient  verbalized understanding and consent signed and witnessed.  The patient  was given a spinal per anesthesia and prepped and draped in normal  sterile fashion.  A Pfannenstiel skin incision was made at the site of  the prior scar and carried down to the underlying layer of fascia with  the scalpel and Bovie.  The fascia was excised bilaterally in the  midline extended bilaterally with the Mayo scissors.  Kocher clamps were  placed on the superior aspect of the fascial incision and the rectus  muscle excised from the fascia.  The same was done on the inferior  aspect of the fascial incision.  The muscle was separated in the  midline.  The peritoneum entered bluntly and extended manually.  Bladder  blade was placed and large varicosities noted in the lower uterine  segment.  Bladder flap was created and uterine incision made in the  lower uterine segment, however, the uterine incision was a little bit  higher than usual secondary to the large varicosities.  The incision was  carried out bilaterally using the bandage scissors.  Membranes ruptured  and clear fluid noted.  The infant was delivered in the vertex  presentation and cord clamped and cut and the infant handed to the  awaiting pediatricians.  Cord blood was collected and clindamycin  administered.  Placenta was sent to pathology.  The placenta was removed  via fundal massage and the uterus cleared of all clots and debris.  The  uterine incision was repaired in three layers, the first layer was a  running interlocking stitch of zero Vicryl.  The second layer was an  imbricating layer of zero Vicryl and the third layer was imbricating  layer of zero Vicryl as well.  There was good hemostasis of the uterine  incision.  The intra-abdominal cavity was copiously irrigated and normal-  appearing bilateral ovaries and fallopian tubes noted.  The peritoneum  was repaired with 2-0 chromic in a running fashion.  The fascia was  repaired with zero Vicryl in a running fashion.  The subcutaneous tissue  was irrigated and made hemostatic with the Bovie.  There was an area in  the midline of the fascial incision which was noted to be separated just  a little bit, although the stitch was noted to be intact and this area  was reinforced with another stitch of zero Vicryl.  The subcutaneous  tissue was reapproximated using 2-0 plain via three interrupted stitches  and the skin was reapproximated 3-0 Monocryl via a subcuticular stitch.  Pressure dressing was applied.  The patient tolerated procedure well and  is currently awaiting transfer to the recovery room in good condition.  Sponge, lap and needle counts were correct.      Osborn Coho, [name]  Electronically Signed     AR/MEDQ  D:  08/05/2007  T:  08/05/2007  Job:  [id number]

## 2010-10-27 NOTE — Assessment & Plan Note (Signed)
[facility]                                 ON-CALL NOTE   CARI, BURGO                         MRN:          [id number]  DATE:09/10/2006                            DOB:          [birth year]    CALL FROM:  [phone]   TIME OF CALL:  7:17 p.m. on September 10, 2006   She called complaining of severe reaction to medication.  She was given  Imitrex and now she states she is having chest pain and it feels like  her throat is closing in.  I did mention that chest pain could be a side-  effect of Imitrex and she states she did know that and was warned about  that, but she feels like her throat is closing in and having difficulty  swallowing.  She has no Benadryl or any antihistamines in the house.  I  recommended she be taken to the emergency room to be evaluated.     Lelon Perla, DO  Electronically Signed    Shawnie Dapper  DD: 09/10/2006  DT: 09/11/2006  Job #: [id number]   cc:   Gordy Savers, MD

## 2010-10-27 NOTE — Op Note (Signed)
[name], [name] NO.:  [id number]   MEDICAL RECORD NO.:  [id number]          PATIENT TYPE:  INP   LOCATION:  9130                          [facility]ILITY:  [facility]   PHYSICIAN:  Osborn Coho, [name]   DATE OF BIRTH:  12-Mar-1961   DATE OF PROCEDURE:  02/27/2005  DATE OF DISCHARGE:                                 OPERATIVE REPORT   PREOPERATIVE DIAGNOSES:  1.  Term intrauterine pregnancy.  2.  Labor.  3.  Failure to progress.  4.  Nonreassuring fetal heart tracing with thick meconium.   POSTOPERATIVE DIAGNOSES:  1.  Term intrauterine pregnancy.  2.  Labor.  3.  Failure to progress.  4.  Nonreassuring fetal heart tracing with thick meconium.   PROCEDURE:  Primary low transverse Cesarean section.   ANESTHESIA:  Epidural.   ATTENDING:  Osborn Coho, [name]   ASSISTANT:  Rica Koyanagi, C.N.M.   FLUIDS:  3000 mL.   ESTIMATED BLOOD LOSS:  600 mL.   URINE OUTPUT:  Clear by end of case, approximately 250 mL.   COMPLICATIONS:  None.   FINDINGS:  Live female infant with Apgars of 7 at one minute and 9 at five  minutes.  Normal-appearing bilateral ovaries and fallopian tubes.   PROCEDURE:  The patient was taken to the operating room after the risks,  benefits and alternatives were reviewed with the patient.  The patient  verbalized understanding and consent signed and witnessed.  The patient was  given a surgical level via the epidural and prepped and draped in the normal  sterile fashion.  A Pfannenstiel skin incision was made and down through the  underlying layer of fascia with a scalpel.  The fascia was excised  bilaterally in the midline and extended bilaterally with the Mayo scissors.  Kocher clamps were placed on the superior aspect of the fascial incision and  the rectus muscle excised from the fascia.  The same was done on the  inferior aspect of the fascial incision.  The muscle was separated in the  midline and the peritoneum entered bluntly and  extended manually.  Bladder  blade placed and a bladder flap created with the Metzenbaum scissors.  The  uterine incision was made with a scalpel and extended bilaterally with the  bandage scissors.  The infant was somewhat asynclitic in the right occiput  transverse presentation.  The infant's oropharynx and nasopharynx were DeLee  suctioned secondary to thick meconium.  The infant was delivered without  difficulty and after cord clamped and cut, the infant handed to the waiting  pediatricians.  The uterus was cleared of all debris after the placenta was  removed manually.  The uterine incision was repaired with 0 Vicryl in a  running locked fashion and a second imbricating layer was performed.  Copious irrigation of the intra-abdominal cavity was performed and adnexal  findings as noted above.  The peritoneum was closed with 2-0 chromic in a  running fashion.  The fascia was repaired with 0 Vicryl in a running  fashion.  The subcutaneous tissue was irrigated and made hemostatic with  the  Bovie.  A subcuticular stitch was performed to reapproximate the skin using  3-0 Monocryl.  Sponge, lap and needle count was correct.  The patient tolerated the procedure well and is returned to recovery room in  good condition.      Osborn Coho, [name]  Electronically Signed     AR/MEDQ  D:  02/27/2005  T:  02/27/2005  Job:  [id number]

## 2010-10-27 NOTE — Discharge Summary (Signed)
[name], [name] NO.:  [id number]   MEDICAL RECORD NO.:  [id number]          PATIENT TYPE:  INP   LOCATION:  9130                          [facility]  [facility]   PHYSICIAN:  Janine Limbo, M.D.DATE OF BIRTH:  22-Mar-1961   DATE OF ADMISSION:  02/27/2005  DATE OF DISCHARGE:  03/02/2005                                 DISCHARGE SUMMARY   ADMISSION DIAGNOSES:  1.  Intrauterine pregnancy at 39-4/7 weeks.  2.  Labor.   DISCHARGE DIAGNOSES:  1.  Intrauterine pregnancy at 39-4/7 weeks, delivered.  2.  Primary low transverse cesarean section secondary to failure to progress      and nonreassuring fetal heart rate tracing.   OPERATION/PROCEDURE:  Primary low transverse cesarean section.   HOSPITAL COURSE:  [name] is a 50 year old African American female,  gravida 1, para 0, who presented with spontaneous onset of labor.  The  patient was admitted to birthing suite.  The patient's pregnancy remarkable  for:  1.  Advanced maternal age.  2.  Mitral valve prolapse.  3.  GBS negative.   The patient progressed to complete dilation.  However, fetal heart rate  tracing became nonreassuring with pushing with contractions and meconium-  stained fluid was noted.  Fetal heart rate tracing remained with good short-  term variability.  The patient did bring the vertex of the infant to 0 to +1  station with pushing.  The patient was made for low transverse cesarean  section.  The patient underwent a low transverse cesarean section and  delivered a viable female infant.  Weight 5 pounds 9 ounces with Apgars of 7  and 9 at one and five minutes respectively.  Surgery was uncomplicated.  Estimated blood loss 600 mL.  The patient's postoperative course was  unremarkable.  On postoperative day #3 the patient was ambulating, voiding  and tolerating liquids and solids without difficulty and she was deemed to  have received the full benefit of her hospital stay.  The patient's  postoperative hemoglobin at 8.9 and the patient was without orthostatic  symptoms.  The patient was discharged home.   CONDITION ON DISCHARGE:  Stable.   DISCHARGE INSTRUCTIONS:  Per [facility] hand-out.   DISCHARGE MEDICATIONS:  1.  Motrin 600 mg every six hours p.r.n. pain.  2.  Tylox one to two tablets p.o. q.3-4h. p.r.n. pain .  3.  Prenatal vitamins one tablet daily.  4.  Repliva one tablet daily.   FOLLOW UP:  At six weeks postpartum at [facility].     ______________________________  Rhona Leavens, CNM      Janine Limbo, [name]  Electronically Signed    NOS/MEDQ  D:  03/02/2005  T:  03/03/2005  Job:  [id number]

## 2010-10-27 NOTE — H&P (Signed)
[name], [name]                ACCOUNT NO.:  [id number]   MEDICAL RECORD NO.:  [id number]          PATIENT TYPE:  INP   LOCATION:  9166                          [facility]ACILITY:  [facility]   PHYSICIAN:  Crist Fat. Rivard, [name] DATE OF BIRTH:  Feb 27, 1961   DATE OF ADMISSION:  02/27/2005  DATE OF DISCHARGE:                                HISTORY & PHYSICAL   This is a 50 year old gravida 1, para 0, at 39-4/7 weeks, who presents for a  labor evaluation.  She denies leaking or bleeding, reports positive fetal  movement.  Pregnancy has been followed by Dr. Su Hilt and remarkable for:   1.  AMA.  2.  Mitral valve prolapse.   OBSTETRICAL HISTORY:  The patient is a primigravida.   MEDICAL HISTORY:  Remarkable for an abnormal Pap in 2001 with normal ones  since then.  Fibrocystic breast disease.  Childhood varicella.  She has a  history of a heart murmur for which she takes prophylactic antibiotics,  although she does not know which ones.  She reports a rash with PENICILLIN  many years ago.  She has a history of anemia.  Ulcer in 2001.  Constipation.  She has a history of migraines.   SURGICAL HISTORY:  Remarkable for wisdom teeth in 1983, laparoscopy in 2004.   FAMILY HISTORY:  Remarkable for mother and aunt with hypertension, aunt with  asthma, and aunt x2 with diabetes.  Grandmother with stroke.  Cousins with  depression.   GENETIC HISTORY:  Remarkable for the patient's age of 87, sister and aunt  with scoliosis and mother and aunts with twins.   SOCIAL HISTORY:  The patient is married to Jearld Fenton, who is involved  and supportive.  She attends the [employer].  She works as a Holiday representative.  She denies any alcohol, tobacco or drug use.   PRENATAL LABORATORY DATA:  Hemoglobin 11, platelets 370.  Blood type O  positive, antibody screen negative.  Sickle cell negative.  RPR nonreactive.  Rubella immune.  Hepatitis negative.  HIV negative.  Pap test normal.  Gonorrhea  negative, Chlamydia negative.  Cystic fibrosis negative.   HISTORY OF CURRENT PREGNANCY:  The patient entered care at 10 weeks'  gestation.  She initially requested an amniocentesis but later declined.  She had ultrasound at 18 weeks, which was normal.  There were no soft  markers for trisomy 21 noted.  She complained of pressure at 21 weeks.  This  was felt to be constipation and was treated.  She had a Glucola at 26 weeks,  which was elevated, and then had a normal three-hour GTT.  Group B strep at  term is unavailable.   OBJECTIVE:  VITAL SIGNS:  Stable, afebrile.  HEENT:  Within normal limits.  NECK:  Thyroid normal, not enlarged.  CHEST:  Clear to auscultation.  CARDIAC:  Regular rate and rhythm.  ABDOMEN:  Gravid at 39 cm, vertex to Leopold's.  EFM shows reactive fetal  heart rate with uterine contractions every three minutes.  PELVIC:  Cervix is 4, 90%, -2, with a vertex presentation.  There is dark  brown discharge noted but no bright red bleeding and no leaking of fluid.  EXTREMITIES:  Within normal limits.   ASSESSMENT:  1.  Intrauterine pregnancy at 39-4/7 weeks.  2.  Active labor.   PLAN:  1.  Admit to birthing suites per Dr. Estanislado Pandy.  2.  Routine [name] orders.  3.  Clindamycin and gentamicin for mitral valve prolapse prophylaxis.  4.  Declines epidural for now.      Marie L. Williams, C.N.M.      Crist Fat Rivard, [name]  Electronically Signed    MLW/MEDQ  D:  02/27/2005  T:  02/27/2005  Job:  [id number]

## 2011-01-05 ENCOUNTER — Emergency Department (HOSPITAL_BASED_OUTPATIENT_CLINIC_OR_DEPARTMENT_OTHER)
Admission: EM | Admit: 2011-01-05 | Discharge: 2011-01-05 | Disposition: A | Discharge status: Home / Self Care | Payer: Managed Care, Other (non HMO) | Attending: Emergency Medicine | Admitting: Emergency Medicine

## 2011-01-05 ENCOUNTER — Encounter (HOSPITAL_BASED_OUTPATIENT_CLINIC_OR_DEPARTMENT_OTHER): Payer: Self-pay | Admitting: *Deleted

## 2011-01-05 DIAGNOSIS — T23139A Burn of first degree of unspecified multiple fingers (nail), not including thumb, initial encounter: Secondary | ICD-10-CM | POA: Insufficient documentation

## 2011-01-05 DIAGNOSIS — W860XXA Exposure to domestic wiring and appliances, initial encounter: Secondary | ICD-10-CM | POA: Insufficient documentation

## 2011-01-05 DIAGNOSIS — Y92009 Unspecified place in unspecified non-institutional (private) residence as the place of occurrence of the external cause: Secondary | ICD-10-CM | POA: Insufficient documentation

## 2011-01-05 DIAGNOSIS — T23039A Burn of unspecified degree of unspecified multiple fingers (nail), not including thumb, initial encounter: Secondary | ICD-10-CM

## 2011-01-05 MED ORDER — IBUPROFEN 800 MG PO TABS: 800.0000 mg | ORAL_TABLET | Freq: Three times a day (TID) | ORAL | Status: AC

## 2011-01-05 MED ORDER — SILVER SULFADIAZINE 1 % EX CREA
TOPICAL_CREAM | Freq: Once | CUTANEOUS | Status: AC
  Administered 2011-01-05: 20:00:00 via TOPICAL
  Filled 2011-01-05: qty 85

## 2011-01-05 MED ORDER — SILVER SULFADIAZINE 1 % EX CREA: TOPICAL_CREAM | Freq: Every day | CUTANEOUS | Status: DC
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
## 2011-01-06 NOTE — ED Provider Notes (Signed)
Evaluation and management procedures were performed by the PA/NP under my supervision/collaboration.   Dione Booze, MD 01/06/11 [phone]

## 2011-01-19 ENCOUNTER — Other Ambulatory Visit: Payer: Self-pay | Admitting: Obstetrics and Gynecology

## 2011-01-19 DIAGNOSIS — Z1231 Encounter for screening mammogram for malignant neoplasm of breast: Secondary | ICD-10-CM

## 2011-02-02 ENCOUNTER — Ambulatory Visit
Admission: RE | Admit: 2011-02-02 | Discharge: 2011-02-02 | Disposition: A | Discharge status: Home / Self Care | Payer: Managed Care, Other (non HMO) | Source: Ambulatory Visit | Attending: Obstetrics and Gynecology | Admitting: Obstetrics and Gynecology

## 2011-02-02 DIAGNOSIS — Z1231 Encounter for screening mammogram for malignant neoplasm of breast: Secondary | ICD-10-CM

## 2011-03-02 LAB — CBC
HCT: 21.6 — ABNORMAL LOW
HCT: 22.4 — ABNORMAL LOW
HCT: 30.4 — ABNORMAL LOW
Hemoglobin: 10.6 — ABNORMAL LOW
Hemoglobin: 7.7 — CL
MCHC: 34.8
MCV: 87.6
MCV: 87.8
Platelets: 237
Platelets: 254
Platelets: 310
RBC: 2.45 — ABNORMAL LOW
RBC: 3.47 — ABNORMAL LOW
RDW: 16.2 — ABNORMAL HIGH
RDW: 16.3 — ABNORMAL HIGH
WBC: 8.2
WBC: 8.6
WBC: 8.9

## 2011-03-02 LAB — URINE MICROSCOPIC-ADD ON

## 2011-03-02 LAB — URINE CULTURE
Colony Count: >=100000
Special Requests: POSITIVE

## 2011-03-02 LAB — URINALYSIS, ROUTINE W REFLEX MICROSCOPIC
Bilirubin Urine: NEGATIVE
Glucose, UA: NEGATIVE
Ketones, ur: NEGATIVE
Leukocytes, UA: NEGATIVE
Nitrite: NEGATIVE
Protein, ur: NEGATIVE
Specific Gravity, Urine: 1.015
Urobilinogen, UA: 0.2
pH: 7

## 2011-03-02 LAB — RPR: RPR Ser Ql: NONREACTIVE

## 2011-03-13 LAB — COMPREHENSIVE METABOLIC PANEL
Albumin: 4.4
BUN: 12
Creatinine, Ser: 0.7
Total Bilirubin: 1.1
Total Protein: 7.5

## 2011-03-13 LAB — URINALYSIS, ROUTINE W REFLEX MICROSCOPIC
Glucose, UA: NEGATIVE
Ketones, ur: NEGATIVE
Protein, ur: NEGATIVE
Urobilinogen, UA: 1

## 2011-03-13 LAB — CBC
HCT: 35.7 — ABNORMAL LOW
MCV: 89.9
Platelets: 308
RDW: 13

## 2011-03-13 LAB — DIFFERENTIAL
Basophils Absolute: 0.1
Basophils Relative: 1
Monocytes Absolute: 0.7
Neutro Abs: 6
Neutrophils Relative %: 73

## 2011-03-13 LAB — LIPASE, BLOOD: Lipase: 66

## 2011-03-21 LAB — POCT URINALYSIS DIP (DEVICE)
Protein, ur: NEGATIVE
Specific Gravity, Urine: 1.02
pH: 7

## 2011-04-24 ENCOUNTER — Encounter: Payer: Self-pay | Admitting: Internal Medicine

## 2011-04-24 ENCOUNTER — Ambulatory Visit (INDEPENDENT_AMBULATORY_CARE_PROVIDER_SITE_OTHER): Payer: Managed Care, Other (non HMO) | Admitting: Internal Medicine

## 2011-04-24 VITALS — BP 120/80 | Temp 97.8°F | Wt 164.0 lb

## 2011-04-24 DIAGNOSIS — Z23 Encounter for immunization: Secondary | ICD-10-CM

## 2011-04-24 DIAGNOSIS — Z0000 Encounter for general adult medical examination without abnormal findings: Secondary | ICD-10-CM

## 2011-04-24 DIAGNOSIS — R208 Other disturbances of skin sensation: Secondary | ICD-10-CM

## 2011-04-24 DIAGNOSIS — R209 Unspecified disturbances of skin sensation: Secondary | ICD-10-CM

## 2011-04-24 NOTE — Progress Notes (Signed)
Subjective:    Patient ID: [name], female    DOB: [birth year], 50 y.o.   MRN: [id number]  HPI  50 year old patient who presents with a chief complaint of numbness involving the legs.  She states that she was seen in the ED greater than one year ago with some right leg numbness. She was told at that time that she had sciatica. In January of this year she began having bilateral lower extremity numbness. For the past several weeks she has had the more painful dysesthesias in addition to the numbness. This involves the entire lower extremities bilaterally. She also describes a more localized pain in the right lumbosacral area. She denies any bowel or bladder issues. For the past month she has noted a heavy sensation in the anterior thighs. For the past 2 or 3 months she has had some nonspecific dizziness one month ago she states she had a frank syncopal episode when on the kitchen floor. She takes no chronic medications.    Review of Systems  HENT: Negative for hearing loss, congestion, sore throat, rhinorrhea, dental problem, sinus pressure and tinnitus.   Eyes: Negative for pain, discharge and visual disturbance.  Respiratory: Negative for cough and shortness of breath.   Cardiovascular: Negative for chest pain, palpitations and leg swelling.  Gastrointestinal: Negative for nausea, vomiting, abdominal pain, diarrhea, constipation, blood in stool and abdominal distention.  Genitourinary: Negative for dysuria, urgency, frequency, hematuria, flank pain, vaginal bleeding, vaginal discharge, difficulty urinating, vaginal pain and pelvic pain.  Musculoskeletal: Negative for joint swelling, arthralgias and gait problem.  Skin: Negative for rash.  Neurological: Positive for dizziness, syncope, weakness and light-headedness. Negative for speech difficulty, numbness and headaches.       Numbness and painful dysesthesias involving both legs  Hematological: Negative for adenopathy.    Psychiatric/Behavioral: Negative for behavioral problems, dysphoric mood and agitation. The patient is not nervous/anxious.        Objective:   Physical Exam  Constitutional: She is oriented to person, place, and time. She appears well-developed and well-nourished.  HENT:  Head: Normocephalic.  Right Ear: External ear normal.  Left Ear: External ear normal.  Mouth/Throat: Oropharynx is clear and moist.  Eyes: Conjunctivae and EOM are normal. Pupils are equal, round, and reactive to light.  Neck: Normal range of motion. Neck supple. No thyromegaly present.  Cardiovascular: Normal rate, regular rhythm, normal heart sounds and intact distal pulses.   Pulmonary/Chest: Effort normal and breath sounds normal.  Abdominal: Soft. Bowel sounds are normal. She exhibits no mass. There is no tenderness.  Musculoskeletal: Normal range of motion.       Straight leg testing negative Slight tenderness noted in the right lumbosacral region  Lymphadenopathy:    She has no cervical adenopathy.  Neurological: She is alert and oriented to person, place, and time. She has normal reflexes. She displays normal reflexes. No cranial nerve deficit. She exhibits normal muscle tone. Coordination normal.       There appeared to be no weakness in hip flexion Patellar and Achilles reflexes were normal The patient is a squat without difficulty and able to walk on her toes and heels without difficulty; She was able to hop on either foot  Skin: Skin is warm and dry. No rash noted.  Psychiatric: She has a normal mood and affect. Her behavior is normal.          Assessment & Plan:   Lower extremity dysesthesias.  We'll obtain a lumbar MRI to rule out a  central disc or spinal stenosis. Her clinical exam is nonrevealing. Dependent results of the MRI may consider neurosurgical or neurological evaluation. May need nerve conduction studies or EMG. We'll call immediately if there is any worsening symptoms or any bowel  or bladder dysfunction

## 2011-04-24 NOTE — Patient Instructions (Signed)
Call immediately if there is any further worsening of symptoms  Lumbar MRI as discussed

## 2011-04-30 ENCOUNTER — Telehealth: Payer: Self-pay | Admitting: Internal Medicine

## 2011-04-30 DIAGNOSIS — M549 Dorsalgia, unspecified: Secondary | ICD-10-CM

## 2011-04-30 DIAGNOSIS — M48061 Spinal stenosis, lumbar region without neurogenic claudication: Secondary | ICD-10-CM

## 2011-04-30 NOTE — Telephone Encounter (Signed)
Spoke with [facility] - they have order - but pt didn't show 11/13 , called pt , she states she did MRI at [facility] and they gave her a CD -  I will call in AM to get results and find out who sent her to [facility]. KIK

## 2011-04-30 NOTE — Telephone Encounter (Signed)
Please advise 

## 2011-04-30 NOTE — Telephone Encounter (Signed)
Pt called and is req to get MRI results. Pt said that she didn't know if she needs to sch another ov based on results or not. Pls call pt asap.

## 2011-05-01 NOTE — Telephone Encounter (Signed)
Results on your desk for review

## 2011-05-01 NOTE — Telephone Encounter (Signed)
Spoke with pt  After dr. Amador Cunas reviewed MRI - will need referral to neuro surg for futher eval - spinal stenosis  Order done - terri will call once scheduled.

## 2011-05-01 NOTE — Telephone Encounter (Signed)
Pt called to get results from MRI. Pt said that she had mri done at [facility] on 04/27/11. Pls call.

## 2011-05-01 NOTE — Telephone Encounter (Signed)
Spoke with terri - this was done at [facility] per Ins. - cost effective. I have call them to fax results. KIK

## 2011-05-01 NOTE — Telephone Encounter (Signed)
Results MRI??

## 2011-05-14 ENCOUNTER — Encounter: Payer: Self-pay | Admitting: Internal Medicine

## 2011-06-06 ENCOUNTER — Other Ambulatory Visit: Payer: Self-pay | Admitting: Specialist

## 2011-06-06 DIAGNOSIS — R51 Headache: Secondary | ICD-10-CM

## 2011-06-14 ENCOUNTER — Other Ambulatory Visit: Payer: Managed Care, Other (non HMO)

## 2011-06-25 ENCOUNTER — Ambulatory Visit (INDEPENDENT_AMBULATORY_CARE_PROVIDER_SITE_OTHER): Payer: Managed Care, Other (non HMO) | Admitting: Internal Medicine

## 2011-06-25 ENCOUNTER — Encounter: Payer: Self-pay | Admitting: Internal Medicine

## 2011-06-25 VITALS — BP 102/70 | Temp 98.1°F

## 2011-06-25 DIAGNOSIS — N912 Amenorrhea, unspecified: Secondary | ICD-10-CM

## 2011-06-25 LAB — POCT URINE PREGNANCY: Preg Test, Ur: NEGATIVE

## 2011-06-25 NOTE — Progress Notes (Signed)
  Subjective:    Patient ID: [name], female    DOB: 1960-08-11, 51 y.o.   MRN: [id number]  HPI  51 year old patient who is seen today concerned about a possible intrauterine pregnancy. She has been placed on recent medication for migraine prophylaxis. Her last normal menstrual period was on November 5.  She has noticed some mild nausea and perhaps some mild breast tenderness. Her mother did not have an early menopause. She has been under some situational stress due to a recent move. She does describe some hot flashes but these predate 2 pregnancies and have been present for years and unchanged.    Review of Systems  Genitourinary: Positive for menstrual problem.       Objective:   Physical Exam  Constitutional: She appears well-developed and well-nourished. No distress.          Assessment & Plan:   Secondary amenorrhea. Patient is quite anxious about a possible pregnancy and the use of her current medications. A urine pregnancy test was negative but we'll proceed with a hCG for added reassurance.

## 2011-06-25 NOTE — Patient Instructions (Signed)
We'll proceed with serum pregnancy test as discussed  Call or return to clinic prn if these symptoms worsen or fail to improve as anticipated.

## 2011-06-26 ENCOUNTER — Telehealth: Payer: Self-pay | Admitting: Internal Medicine

## 2011-06-26 ENCOUNTER — Ambulatory Visit: Payer: Managed Care, Other (non HMO)

## 2011-06-26 DIAGNOSIS — N912 Amenorrhea, unspecified: Secondary | ICD-10-CM

## 2011-06-26 NOTE — Telephone Encounter (Signed)
Attempt to call- VM for someone I dont recognize as hers - no msg left - no results posted yet

## 2011-06-26 NOTE — Telephone Encounter (Signed)
Pt would like blood work results °

## 2011-06-27 NOTE — Telephone Encounter (Signed)
Attempt to call- VM at hm # - LMTCB if problem - results has not yet posted.

## 2011-06-28 NOTE — Telephone Encounter (Signed)
Returning our call- we called husbands phone - her contact number is hm# - [phone] Please call

## 2011-06-28 NOTE — Telephone Encounter (Signed)
Left vm for patient

## 2011-07-02 NOTE — Progress Notes (Signed)
Addended by: Rita Ohara R on: 07/02/2011 02:43 PM   Modules accepted: Orders

## 2011-07-03 NOTE — Progress Notes (Signed)
Quick Note:  Pt aware ______ 

## 2011-10-12 ENCOUNTER — Ambulatory Visit
Admission: RE | Admit: 2011-10-12 | Discharge: 2011-10-12 | Disposition: A | Discharge status: Home / Self Care | Payer: Managed Care, Other (non HMO) | Source: Ambulatory Visit | Attending: Specialist | Admitting: Specialist

## 2011-10-12 DIAGNOSIS — R51 Headache: Secondary | ICD-10-CM

## 2012-01-03 ENCOUNTER — Other Ambulatory Visit: Payer: Self-pay | Admitting: Obstetrics and Gynecology

## 2012-01-03 DIAGNOSIS — Z1231 Encounter for screening mammogram for malignant neoplasm of breast: Secondary | ICD-10-CM

## 2012-01-16 ENCOUNTER — Ambulatory Visit: Payer: Self-pay | Admitting: Obstetrics and Gynecology

## 2012-02-04 ENCOUNTER — Ambulatory Visit: Payer: Managed Care, Other (non HMO)

## 2012-02-06 ENCOUNTER — Ambulatory Visit: Payer: Managed Care, Other (non HMO)

## 2012-02-20 ENCOUNTER — Ambulatory Visit: Payer: Managed Care, Other (non HMO) | Admitting: Obstetrics and Gynecology

## 2012-03-25 ENCOUNTER — Encounter: Payer: Self-pay | Admitting: Internal Medicine

## 2012-03-25 ENCOUNTER — Ambulatory Visit (INDEPENDENT_AMBULATORY_CARE_PROVIDER_SITE_OTHER): Payer: Managed Care, Other (non HMO) | Admitting: Internal Medicine

## 2012-03-25 VITALS — BP 110/80 | Temp 97.9°F | Wt 156.0 lb

## 2012-03-25 DIAGNOSIS — H6090 Unspecified otitis externa, unspecified ear: Secondary | ICD-10-CM

## 2012-03-25 DIAGNOSIS — H60399 Other infective otitis externa, unspecified ear: Secondary | ICD-10-CM

## 2012-03-25 DIAGNOSIS — Z23 Encounter for immunization: Secondary | ICD-10-CM

## 2012-03-25 DIAGNOSIS — R51 Headache: Secondary | ICD-10-CM

## 2012-03-25 MED ORDER — TRAMADOL HCL 50 MG PO TABS: 50.0000 mg | ORAL_TABLET | Freq: Three times a day (TID) | ORAL | Status: DC | PRN

## 2012-03-25 MED ORDER — NEOMYCIN-POLYMYXIN-HC 3.5-10000-1 OT SOLN: 3.0000 [drp] | Freq: Four times a day (QID) | OTIC | Status: DC

## 2012-03-25 NOTE — Addendum Note (Signed)
Addended by: Duard Brady I on: 03/25/2012 02:15 PM   Modules accepted: Orders

## 2012-03-25 NOTE — Patient Instructions (Signed)
Call or return to clinic prn if these symptoms worsen or fail to improve as anticipated.

## 2012-03-25 NOTE — Progress Notes (Signed)
Subjective:    Patient ID: [name], female    DOB: 10-08-1960, 51 y.o.   MRN: [id number]  HPI  51 year old patient who has been ill for about 10 days she has had some sinus congestion and headaches. Today her headaches have improved but during the night developed significant left ear discomfort. There's been no drainage hearing loss or tinnitus. She does have a history of migraine headaches  Past Medical History  Diagnosis Date  . GALLSTONES 03/23/2010  . NONSPECIFIC ABNORM FIND RAD&OTH EXAM BILI TRACT 02/02/2008  . OVARIAN CYST 03/23/2010  . Female infertility   . History of rubella     As a child   . H/O varicella     As a child   . Abnormal Pap smear 2001  . Hx: UTI (urinary tract infection) 04/2003  . ANEMIA 03/23/2010  . AMA (advanced maternal age) multigravida 35+   . Ulcer 2001  . H/O constipation 2001  . Mild mitral valve prolapse   . Headache 12/18/2006    Frequently  . Migraine   . H/O mumps   . Dyspareunia 2008    History   Social History  . Marital Status: Married    Spouse Name: N/A    Number of Children: N/A  . Years of Education: N/A   Occupational History  . Not on file.   Social History Main Topics  . Smoking status: Never Smoker   . Smokeless tobacco: Never Used  . Alcohol Use: No  . Drug Use: No  . Sexually Active: Yes    Birth Control/ Protection: None   Other Topics Concern  . Not on file   Social History Narrative  . No narrative on file    Past Surgical History  Procedure Date  . Laparoscopic ovarian cystectomy   . Cesarean section   . Laparoscopic ablation renal mass   . Wisdom tooth extraction     Family History  Problem Relation Age of Onset  . Hypertension Mother   . Stroke Mother   . Heart disease Father   . Hypertension Maternal Aunt   . Asthma Maternal Aunt   . Diabetes Maternal Aunt     Allergies  Allergen Reactions  . Sumatriptan Anaphylaxis  . Latex Itching  . Penicillins Hives and Swelling    Current  Outpatient Prescriptions on File Prior to Visit  Medication Sig Dispense Refill  . baclofen (LIORESAL) 20 MG tablet Take 20 mg by mouth 2 (two) times daily.      . Multiple Vitamin (MULTIVITAMIN) tablet Take 1 tablet by mouth daily.        Marland Kitchen topiramate (TOPAMAX) 25 MG tablet Take 75 mg by mouth at bedtime.        BP 110/80  Temp 97.9 F (36.6 C)  Wt 156 lb (70.761 kg)       Review of Systems  Constitutional: Negative.   HENT: Positive for congestion. Negative for hearing loss, sore throat, rhinorrhea, dental problem, sinus pressure, tinnitus and ear discharge.   Eyes: Negative for pain, discharge and visual disturbance.  Respiratory: Negative for cough and shortness of breath.   Cardiovascular: Negative for chest pain, palpitations and leg swelling.  Gastrointestinal: Negative for nausea, vomiting, abdominal pain, diarrhea, constipation, blood in stool and abdominal distention.  Genitourinary: Negative for dysuria, urgency, frequency, hematuria, flank pain, vaginal bleeding, vaginal discharge, difficulty urinating, vaginal pain and pelvic pain.  Musculoskeletal: Negative for joint swelling, arthralgias and gait problem.  Skin: Negative for rash.  Neurological:  Positive for headaches. Negative for dizziness, syncope, speech difficulty, weakness and numbness.  Hematological: Negative for adenopathy.  Psychiatric/Behavioral: Negative for behavioral problems, dysphoric mood and agitation. The patient is not nervous/anxious.        Objective:   Physical Exam  Constitutional: She appears well-developed and well-nourished.       Afebrile  HENT:  Head: Normocephalic and atraumatic.  Right Ear: External ear normal.  Mouth/Throat: Oropharynx is clear and moist. No oropharyngeal exudate.       The anterior and inferior aspect of the mid canal was erythematous  Neck:       Mild preauricular tenderness but no definite adenopathy          Assessment & Plan:   Otitis externa.  Will treat with antibiotics and analgesics.

## 2012-04-14 ENCOUNTER — Ambulatory Visit: Payer: Managed Care, Other (non HMO)

## 2012-04-21 ENCOUNTER — Telehealth: Payer: Self-pay | Admitting: Obstetrics and Gynecology

## 2012-04-22 ENCOUNTER — Encounter: Payer: Self-pay | Admitting: Obstetrics and Gynecology

## 2012-04-22 ENCOUNTER — Ambulatory Visit (INDEPENDENT_AMBULATORY_CARE_PROVIDER_SITE_OTHER): Payer: Managed Care, Other (non HMO) | Admitting: Obstetrics and Gynecology

## 2012-04-22 VITALS — BP 108/64 | Resp 16 | Ht 61.0 in | Wt 154.0 lb

## 2012-04-22 DIAGNOSIS — N899 Noninflammatory disorder of vagina, unspecified: Secondary | ICD-10-CM

## 2012-04-22 DIAGNOSIS — Z113 Encounter for screening for infections with a predominantly sexual mode of transmission: Secondary | ICD-10-CM

## 2012-04-22 DIAGNOSIS — Q649 Congenital malformation of urinary system, unspecified: Secondary | ICD-10-CM

## 2012-04-22 DIAGNOSIS — Z139 Encounter for screening, unspecified: Secondary | ICD-10-CM

## 2012-04-22 DIAGNOSIS — N898 Other specified noninflammatory disorders of vagina: Secondary | ICD-10-CM

## 2012-04-22 DIAGNOSIS — R319 Hematuria, unspecified: Secondary | ICD-10-CM

## 2012-04-22 LAB — POCT URINALYSIS DIPSTICK
Bilirubin, UA: NEGATIVE
Leukocytes, UA: NEGATIVE
pH, UA: 8

## 2012-04-22 LAB — CBC
Hemoglobin: 10.7 g/dL — ABNORMAL LOW (ref 12.0–15.0)
MCH: 29.3 pg (ref 26.0–34.0)
MCV: 86.8 fL (ref 78.0–100.0)
RBC: 3.65 MIL/uL — ABNORMAL LOW (ref 3.87–5.11)

## 2012-04-22 LAB — POCT WET PREP (WET MOUNT)

## 2012-04-22 MED ORDER — NORETHINDRONE 0.35 MG PO TABS: 1.0000 | ORAL_TABLET | Freq: Every day | ORAL | Status: DC

## 2012-04-22 MED ORDER — CLOTRIMAZOLE-BETAMETHASONE 1-0.05 % EX CREA: TOPICAL_CREAM | Freq: Every day | CUTANEOUS | Status: DC

## 2012-04-22 NOTE — Progress Notes (Signed)
Here secondary to ext vaginal irritation. C/o hot flashes and body changing.  Filed Vitals:   04/22/12 1312  BP: 108/64  Resp: 16   ROS: noncontributory  Pelvic exam:  VULVA: normal appearing vulva with no masses, tenderness or lesions,  VAGINA: normal appearing vagina with normal color and discharge, no lesions, CERVIX: normal appearing cervix without discharge or lesions,  UTERUS: uterus is normal size, shape, consistency and nontender,  ADNEXA: normal adnexa in size, nontender and no masses.  Results for orders placed in visit on 04/22/12  POCT WET PREP (WET MOUNT)      Component Value Range   Source Wet Prep POC       WBC, Wet Prep HPF POC       Bacteria Wet Prep HPF POC neg     BACTERIA WET PREP MORPHOLOGY POC       Clue Cells Wet Prep HPF POC None     CLUE CELLS WET PREP WHIFF POC Negative Whiff     Yeast Wet Prep HPF POC None     KOH Wet Prep POC       Trichomonas Wet Prep HPF POC none     pH 5.0    POCT URINALYSIS DIPSTICK      Component Value Range   Color, UA yellow     Clarity, UA clear     Glucose, UA neg     Bilirubin, UA neg     Ketones, UA trace     Spec Grav, UA 1.010     Blood, UA 1+     pH, UA 8.0     Protein, UA trace     Urobilinogen, UA negative     Nitrite, UA neg     Leukocytes, UA Negative     A/P Wet prep - ph 5.0 but otherwise neg - trial of rephresh Labs for std testing with pt consent Vulvitis - lotrisone UA with blood - UCX Tsh, fsh, cbc, vit D

## 2012-04-23 LAB — GC/CHLAMYDIA PROBE AMP: GC Probe RNA: NEGATIVE

## 2012-04-23 LAB — HIV ANTIBODY (ROUTINE TESTING W REFLEX): HIV: NONREACTIVE

## 2012-04-23 LAB — HEPATITIS B SURFACE ANTIGEN: Hepatitis B Surface Ag: NEGATIVE

## 2012-04-23 LAB — HSV 1 ANTIBODY, IGG: HSV 1 Glycoprotein G Ab, IgG: 9.47 IV — ABNORMAL HIGH

## 2012-04-28 ENCOUNTER — Telehealth: Payer: Self-pay

## 2012-04-28 ENCOUNTER — Other Ambulatory Visit: Payer: Self-pay

## 2012-04-28 DIAGNOSIS — E559 Vitamin D deficiency, unspecified: Secondary | ICD-10-CM

## 2012-04-28 NOTE — Telephone Encounter (Signed)
LM for pt to cb re: test results. [name], Jacqueline A  

## 2012-04-28 NOTE — Telephone Encounter (Signed)
Spoke to pt re: labs. Vit D is low at 17. She needs Vit D protocol. Vit D 50,000 units 1 po twice weekly x 8 weeks #16 0 RF's called to [facility], per protocol. Pt's iron was low at 10.7. I rec that she take an iron supplement daily.. 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 Appointment booked w/ EP tomorrow to get all questions answered.  Melody Comas A

## 2012-04-29 ENCOUNTER — Encounter: Payer: Self-pay | Admitting: Obstetrics and Gynecology

## 2012-04-29 ENCOUNTER — Ambulatory Visit (INDEPENDENT_AMBULATORY_CARE_PROVIDER_SITE_OTHER): Payer: Managed Care, Other (non HMO) | Admitting: Obstetrics and Gynecology

## 2012-04-29 VITALS — BP 104/70 | HR 72 | Wt 160.0 lb

## 2012-04-29 DIAGNOSIS — N762 Acute vulvitis: Secondary | ICD-10-CM

## 2012-04-29 DIAGNOSIS — B009 Herpesviral infection, unspecified: Secondary | ICD-10-CM

## 2012-04-29 DIAGNOSIS — N760 Acute vaginitis: Secondary | ICD-10-CM

## 2012-04-29 MED ORDER — AMBULATORY NON FORMULARY MEDICATION: 1.0000 | Freq: Two times a day (BID) | Status: DC

## 2012-04-29 MED ORDER — VALACYCLOVIR HCL 500 MG PO TABS: 500.0000 mg | ORAL_TABLET | Freq: Two times a day (BID) | ORAL | Status: DC

## 2012-04-29 NOTE — Patient Instructions (Addendum)
[place] pharmacies that carry Boric Acid Capsules/Suppositories:  [facility] [address] (only)  [phone]  [facility]  [address] ([facility])  [phone]  [facility] ([place])  [address] [phone]  [facility]  [address] [phone]  [facility] [address] [place], [place] [phone]     

## 2012-04-29 NOTE — Progress Notes (Signed)
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  Reviewed transmission, prevention, symptoms/signs, considerations, treatment and management.  Patient was also given the CDC and Valtrex website information.  States she may want her husband to talk to me.  Advised that we could do that best by phone-patient was agreeable.  Also advised that the HSV Glycoprotein 2 Test was the preferred test for HSV-2.   A:  HSV 1 & 2  P:  See above       RTO- as scheduled or prn  Hadia Minier, PA-C

## 2012-05-16 ENCOUNTER — Telehealth: Payer: Self-pay | Admitting: Obstetrics and Gynecology

## 2012-05-16 MED ORDER — VALACYCLOVIR HCL 500 MG PO TABS: 500.0000 mg | ORAL_TABLET | Freq: Two times a day (BID) | ORAL | Status: DC

## 2012-05-16 NOTE — Telephone Encounter (Signed)
Spoke with pt rgd msg informed rx sent to pharm pt voice understanding 

## 2012-05-21 ENCOUNTER — Telehealth: Payer: Self-pay | Admitting: Obstetrics and Gynecology

## 2012-05-21 ENCOUNTER — Other Ambulatory Visit: Payer: Self-pay | Admitting: Obstetrics and Gynecology

## 2012-05-21 MED ORDER — VALACYCLOVIR HCL 500 MG PO TABS: ORAL_TABLET | ORAL | Status: DC

## 2012-05-29 ENCOUNTER — Telehealth: Payer: Self-pay

## 2012-05-29 MED ORDER — FAMCICLOVIR 500 MG PO TABS: ORAL_TABLET | ORAL | Status: DC

## 2012-05-29 NOTE — Telephone Encounter (Signed)
TC TO PT REGARDING MESSAGE. PT STATES THAT SHE WANT TO TRY SOMETHING OTHER THAN VALTREX TO SEE IF IT WOULD HELP PT OUTBREAK. PER EP WILL SEND IN FAMVIR 500 MG TO SEE IF IT WOULD HELP AND ADVISED PT TO TAKE AS PRESCRIBED. INFORMED PT THAT IF SHE HAVE PROBLEMS WITH THIS MED; PT NEED TO COME IN FOR EVAL. PT VOICED UNDERSTANDING.

## 2012-05-29 NOTE — Telephone Encounter (Signed)
DONE

## 2012-06-06 ENCOUNTER — Telehealth: Payer: Self-pay | Admitting: Obstetrics and Gynecology

## 2012-06-06 NOTE — Telephone Encounter (Signed)
Tc to pt per telephone call. Informed to keep appt sched 06/16/12. Pt to call to check for cx's if desires. Pt voices understanding.

## 2012-06-16 ENCOUNTER — Ambulatory Visit (INDEPENDENT_AMBULATORY_CARE_PROVIDER_SITE_OTHER): Payer: Managed Care, Other (non HMO) | Admitting: Obstetrics and Gynecology

## 2012-06-16 ENCOUNTER — Encounter: Payer: Self-pay | Admitting: Obstetrics and Gynecology

## 2012-06-16 VITALS — BP 122/78 | Resp 14 | Ht 61.0 in | Wt 162.0 lb

## 2012-06-16 DIAGNOSIS — N926 Irregular menstruation, unspecified: Secondary | ICD-10-CM

## 2012-06-16 DIAGNOSIS — Z124 Encounter for screening for malignant neoplasm of cervix: Secondary | ICD-10-CM

## 2012-06-16 DIAGNOSIS — Z139 Encounter for screening, unspecified: Secondary | ICD-10-CM

## 2012-06-16 DIAGNOSIS — Z01419 Encounter for gynecological examination (general) (routine) without abnormal findings: Secondary | ICD-10-CM

## 2012-06-16 LAB — POCT URINE PREGNANCY: Preg Test, Ur: NEGATIVE

## 2012-06-16 MED ORDER — VALACYCLOVIR HCL 1 G PO TABS: 1000.0000 mg | ORAL_TABLET | Freq: Every day | ORAL | Status: DC

## 2012-06-16 MED ORDER — ACYCLOVIR 5 % EX OINT: TOPICAL_OINTMENT | CUTANEOUS | Status: DC

## 2012-06-16 NOTE — Addendum Note (Signed)
Addended by: Osborn Coho on: 06/16/2012 05:18 PM   Modules accepted: Orders

## 2012-06-16 NOTE — Progress Notes (Addendum)
Patient ID: Darielys Giglia, female   DOB: March 14, 1961, 52 y.o.   MRN: [id number] Contraception none Last pap 07/2008 wnl Last Mammo 01/2011. Scheduled for later this month. Last Colonoscopy never Last Dexa Scan never Primary MD Dr. Rodena Goldmann Abuse at Home no  Here for AEX but distraught over recent HSV 2 outbreak and dx.  Reports famcyclovir worked better than val... But fam so expensive.  Also reports missing cycles and a heavy one in Dec.  Filed Vitals:   06/16/12 1600  BP: 122/78  Resp: 14   ROS: noncontributory  Physical Examination: General appearance - alert, well appearing, and in no distress Neck - supple, no significant adenopathy Chest - clear to auscultation, no wheezes, rales or rhonchi, symmetric air entry Heart - normal rate and regular rhythm Abdomen - soft, nontender, nondistended, no masses or organomegaly Breasts - breasts appear normal, no suspicious masses, no skin or nipple changes or axillary nodes Pelvic - normal external genitalia, vulva, vagina, cervix, uterus and adnexa Back exam - no CVAT Extremities - no edema, redness or tenderness in the calves or thighs  A/P mammo Trial of valtrex 1000mg  qd AEX in 58yr Check cbc and vit D If has another heavy cycle rec pelvic u/s +/- em bx and sched u/s Zovirax ointment

## 2012-06-17 LAB — CBC
Hemoglobin: 11.4 g/dL — ABNORMAL LOW (ref 12.0–15.0)
MCH: 30.3 pg (ref 26.0–34.0)
MCV: 88.8 fL (ref 78.0–100.0)
Platelets: 381 10*3/uL (ref 150–400)
RBC: 3.76 MIL/uL — ABNORMAL LOW (ref 3.87–5.11)

## 2012-06-17 LAB — PAP IG W/ RFLX HPV ASCU

## 2012-06-19 ENCOUNTER — Telehealth: Payer: Self-pay

## 2012-06-19 NOTE — Telephone Encounter (Signed)
LM for pt re: test results. Rec. That she cont. To take iron once daily. Melody Comas A

## 2012-07-11 ENCOUNTER — Ambulatory Visit
Admission: RE | Admit: 2012-07-11 | Discharge: 2012-07-11 | Disposition: A | Discharge status: Home / Self Care | Payer: Managed Care, Other (non HMO) | Source: Ambulatory Visit | Attending: Obstetrics and Gynecology | Admitting: Obstetrics and Gynecology

## 2012-07-11 DIAGNOSIS — Z1231 Encounter for screening mammogram for malignant neoplasm of breast: Secondary | ICD-10-CM

## 2012-08-18 ENCOUNTER — Other Ambulatory Visit: Payer: Self-pay | Admitting: Obstetrics and Gynecology

## 2012-08-19 ENCOUNTER — Telehealth: Payer: Self-pay

## 2012-08-19 NOTE — Telephone Encounter (Signed)
TC from pt requesting additional rf's Valtrex 1000 mg qd and is requesting it STAT. Informed pt we will have to consult with AR & there is a 72 hr wait for rx's.

## 2012-08-19 NOTE — Telephone Encounter (Signed)
Lm for pt to cb.

## 2012-08-20 ENCOUNTER — Telehealth: Payer: Self-pay

## 2012-08-20 NOTE — Telephone Encounter (Signed)
Valtrex has been approved per JO, CMA 08/20/12 note.

## 2012-08-20 NOTE — Telephone Encounter (Signed)
LM for pt that Valtrex 1,000 mg  # 30 with RF's through 06/2013 has been called to her pharmacy. Melody Comas A

## 2013-01-11 ENCOUNTER — Emergency Department (HOSPITAL_BASED_OUTPATIENT_CLINIC_OR_DEPARTMENT_OTHER)
Admission: EM | Admit: 2013-01-11 | Discharge: 2013-01-11 | Disposition: A | Discharge status: Home / Self Care | Payer: Managed Care, Other (non HMO) | Attending: Emergency Medicine | Admitting: Emergency Medicine

## 2013-01-11 ENCOUNTER — Encounter (HOSPITAL_BASED_OUTPATIENT_CLINIC_OR_DEPARTMENT_OTHER): Payer: Self-pay | Admitting: *Deleted

## 2013-01-11 DIAGNOSIS — Z8679 Personal history of other diseases of the circulatory system: Secondary | ICD-10-CM | POA: Insufficient documentation

## 2013-01-11 DIAGNOSIS — Z872 Personal history of diseases of the skin and subcutaneous tissue: Secondary | ICD-10-CM | POA: Insufficient documentation

## 2013-01-11 DIAGNOSIS — J329 Chronic sinusitis, unspecified: Secondary | ICD-10-CM

## 2013-01-11 DIAGNOSIS — G43909 Migraine, unspecified, not intractable, without status migrainosus: Secondary | ICD-10-CM | POA: Insufficient documentation

## 2013-01-11 DIAGNOSIS — Z8742 Personal history of other diseases of the female genital tract: Secondary | ICD-10-CM | POA: Insufficient documentation

## 2013-01-11 DIAGNOSIS — Z8719 Personal history of other diseases of the digestive system: Secondary | ICD-10-CM | POA: Insufficient documentation

## 2013-01-11 DIAGNOSIS — Z79899 Other long term (current) drug therapy: Secondary | ICD-10-CM | POA: Insufficient documentation

## 2013-01-11 DIAGNOSIS — Z8619 Personal history of other infectious and parasitic diseases: Secondary | ICD-10-CM | POA: Insufficient documentation

## 2013-01-11 DIAGNOSIS — Z87440 Personal history of urinary (tract) infections: Secondary | ICD-10-CM | POA: Insufficient documentation

## 2013-01-11 DIAGNOSIS — Z862 Personal history of diseases of the blood and blood-forming organs and certain disorders involving the immune mechanism: Secondary | ICD-10-CM | POA: Insufficient documentation

## 2013-01-11 MED ORDER — DOXYCYCLINE HYCLATE 100 MG PO CAPS: 100.0000 mg | ORAL_CAPSULE | Freq: Two times a day (BID) | ORAL | Status: DC

## 2013-01-11 MED ORDER — LORATADINE 10 MG PO TABS: 10.0000 mg | ORAL_TABLET | Freq: Every day | ORAL | Status: DC

## 2013-01-11 MED ORDER — FLUTICASONE PROPIONATE 50 MCG/ACT NA SUSP: 2.0000 | Freq: Every day | NASAL | Status: DC

## 2013-01-11 NOTE — ED Provider Notes (Signed)
CSN: [id number]     Arrival date & time 01/11/13  1315 History     First MD Initiated Contact with Patient 01/11/13 1342     Chief Complaint  Patient presents with  . Facial Pain   (Consider location/radiation/quality/duration/timing/severity/associated sxs/prior Treatment) HPI This 52 year old female has a 2 week history of constant pressure and pain in her frontal and maxillary sinus region with some pain to her upper mouth area and some pain to both years radiating from her sinus region with nasal congestion but no nasal discharge no fever no headache no confusion no rash no sore throat no cough no chest pain no shortness of breath no abdominal pain no vomiting or other concerns, she has been trying over-the-counter analgesics and decongestants without improvement over the last 2 weeks, her pain is moderately severe pressure-like pain she does not want narcotics for the pain, since she has sinus pressure with ear pain bilaterally she wants to make sure she does not have ear infections. Past Medical History  Diagnosis Date  . GALLSTONES 03/23/2010  . NONSPECIFIC ABNORM FIND RAD&OTH EXAM BILI TRACT 02/02/2008  . OVARIAN CYST 03/23/2010  . Female infertility   . History of rubella     As a child   . H/O varicella     As a child   . Abnormal Pap smear 2001  . Hx: UTI (urinary tract infection) 04/2003  . ANEMIA 03/23/2010  . AMA (advanced maternal age) multigravida 35+   . Ulcer 2001  . H/O constipation 2001  . Mild mitral valve prolapse   . Headache(784.0) 12/18/2006    Frequently  . Migraine   . H/O mumps   . Dyspareunia 2008   Past Surgical History  Procedure Laterality Date  . Laparoscopic ovarian cystectomy    . Cesarean section    . Laparoscopic ablation renal mass    . Wisdom tooth extraction     Family History  Problem Relation Age of Onset  . Hypertension Mother   . Stroke Mother   . Heart disease Father   . Hypertension Maternal Aunt   . Asthma Maternal Aunt   .  Diabetes Maternal Aunt    History  Substance Use Topics  . Smoking status: Never Smoker   . Smokeless tobacco: Never Used  . Alcohol Use: No   OB History   Grav Para Term Preterm Abortions TAB SAB Ect Mult Living   2 2 2       2      Review of Systems 10 Systems reviewed and are negative for acute change except as noted in the HPI. Allergies  Sumatriptan; Latex; and Penicillins  Home Medications   Current Outpatient Rx  Name  Route  Sig  Dispense  Refill  . acyclovir ointment (ZOVIRAX) 5 %   Topical   Apply topically every 4 (four) hours. For 7days   15 g   1   . AMBULATORY NON FORMULARY MEDICATION   Vaginal   Place 1 capsule vaginally 2 (two) times daily. Medication Name: Boric Acid Capsule 600 mg 1 pv twice weekly   30 capsule   11   . baclofen (LIORESAL) 20 MG tablet   Oral   Take 20 mg by mouth 2 (two) times daily.         Marland Kitchen doxycycline (VIBRAMYCIN) 100 MG capsule   Oral   Take 1 capsule (100 mg total) by mouth 2 (two) times daily. One po bid x 7 days   14 capsule  0   . famciclovir (FAMVIR) 500 MG tablet      TAKE ONE TABLET 3 TIMES A DAY FOR 10 DAYS.   30 tablet   0   . fluticasone (FLONASE) 50 MCG/ACT nasal spray   Nasal   Place 2 sprays into the nose daily. X 14 days   16 g   0   . loratadine (CLARITIN) 10 MG tablet   Oral   Take 1 tablet (10 mg total) by mouth daily. One po daily x 14 days   14 tablet   0   . Lysine 1000 MG TABS   Oral   Take by mouth.         . Multiple Vitamin (MULTIVITAMIN) tablet   Oral   Take 1 tablet by mouth daily.           Marland Kitchen neomycin-polymyxin-hydrocortisone (CORTISPORIN) otic solution   Left Ear   Place 3 drops into the left ear 4 (four) times daily.   10 mL   1   . norethindrone (MICRONOR,CAMILA,ERRIN) 0.35 MG tablet   Oral   Take 1 tablet (0.35 mg total) by mouth daily.   1 Package   5   . topiramate (TOPAMAX) 25 MG tablet   Oral   Take 75 mg by mouth at bedtime.         . traMADol  (ULTRAM) 50 MG tablet   Oral   Take 1 tablet (50 mg total) by mouth every 8 (eight) hours as needed for pain.   30 tablet   0   . valACYclovir (VALTREX) 1000 MG tablet   Oral   Take 1 tablet (1,000 mg total) by mouth daily.   30 tablet   1   . valACYclovir (VALTREX) 500 MG tablet      1 po bid x 5 days prn   30 tablet   6    BP 127/65  Pulse 77  Temp(Src) 97.9 F (36.6 C) (Oral)  Resp 16  Ht 5' (1.524 m)  Wt 163 lb (73.936 kg)  BMI 31.83 kg/m2  SpO2 100% Physical Exam  Nursing note and vitals reviewed. Constitutional:  Awake, alert, nontoxic appearance.  HENT:  Head: Atraumatic.  Mouth/Throat: Oropharynx is clear and moist. No oropharyngeal exudate.  Tympanic membranes clear bilaterally, external auditory canals clear bilaterally, nasal mucosal congestion noted without purulent discharge currently  Eyes: Right eye exhibits no discharge. Left eye exhibits no discharge.  Neck: Neck supple.  Cardiovascular: Normal rate and regular rhythm.   No murmur heard. Pulmonary/Chest: Effort normal and breath sounds normal. No respiratory distress. She has no wheezes. She has no rales. She exhibits no tenderness.  Abdominal: Soft. There is no tenderness. There is no rebound.  Musculoskeletal: She exhibits no tenderness.  Baseline ROM, no obvious new focal weakness.  Lymphadenopathy:    She has no cervical adenopathy.  Neurological:  Mental status and motor strength appears baseline for patient and situation.  Skin: No rash noted.  Psychiatric: She has a normal mood and affect.    ED Course  Patient / Family / Caregiver informed of clinical course, understand medical decision-making process, and agree with plan. Procedures (including critical care time)  Labs Reviewed - No data to display No results found. 1. Sinusitis     MDM  I doubt any other EMC precluding discharge at this time including, but not necessarily limited to the following:meningitis. Cannot r/o sinusitis  vs. allergies.  Hurman Horn, MD 01/11/13 2219

## 2013-01-11 NOTE — ED Notes (Addendum)
Patient c/o ear pain & sinus pressure/pain over the past two weeks, too ibuprofen & antihistamine but no relief, no fever chills, some diarrhea this morning

## 2013-01-14 ENCOUNTER — Telehealth: Payer: Self-pay | Admitting: Internal Medicine

## 2013-01-14 NOTE — Telephone Encounter (Signed)
We have them come back in 72 hours which would be three days.

## 2013-01-14 NOTE — Telephone Encounter (Signed)
Pt needs tb test. Do you have them come back in 2 or 3 days? Pt needs today if possible.

## 2013-01-14 NOTE — Telephone Encounter (Signed)
PT is requesting to have her CPX after 3pm. She is willing to see another provider if she needs to. Please assist.

## 2013-01-15 NOTE — Telephone Encounter (Signed)
Please use two 15 minute slots and block.

## 2013-01-16 ENCOUNTER — Ambulatory Visit (INDEPENDENT_AMBULATORY_CARE_PROVIDER_SITE_OTHER): Payer: Managed Care, Other (non HMO) | Admitting: *Deleted

## 2013-01-16 ENCOUNTER — Other Ambulatory Visit (INDEPENDENT_AMBULATORY_CARE_PROVIDER_SITE_OTHER): Payer: Managed Care, Other (non HMO)

## 2013-01-16 DIAGNOSIS — Z299 Encounter for prophylactic measures, unspecified: Secondary | ICD-10-CM

## 2013-01-16 DIAGNOSIS — Z0000 Encounter for general adult medical examination without abnormal findings: Secondary | ICD-10-CM

## 2013-01-16 DIAGNOSIS — Z111 Encounter for screening for respiratory tuberculosis: Secondary | ICD-10-CM

## 2013-01-16 LAB — CBC WITH DIFFERENTIAL/PLATELET
Basophils Relative: 1 % (ref 0–1)
HCT: 35 % — ABNORMAL LOW (ref 36.0–46.0)
Hemoglobin: 11.8 g/dL — ABNORMAL LOW (ref 12.0–15.0)
Lymphs Abs: 2.1 10*3/uL (ref 0.7–4.0)
MCH: 30.6 pg (ref 26.0–34.0)
MCHC: 33.7 g/dL (ref 30.0–36.0)
Monocytes Absolute: 0.4 10*3/uL (ref 0.1–1.0)
Monocytes Relative: 8 % (ref 3–12)
Neutro Abs: 2.7 10*3/uL (ref 1.7–7.7)
Neutrophils Relative %: 49 % (ref 43–77)
RBC: 3.85 MIL/uL — ABNORMAL LOW (ref 3.87–5.11)

## 2013-01-16 LAB — POCT URINALYSIS DIPSTICK
Glucose, UA: NEGATIVE
Leukocytes, UA: NEGATIVE
Nitrite, UA: NEGATIVE
Protein, UA: NEGATIVE
Spec Grav, UA: 1.02
Urobilinogen, UA: 0.2

## 2013-01-17 LAB — LIPID PANEL
Cholesterol: 135 mg/dL (ref 0–200)
LDL Cholesterol: 84 mg/dL (ref 0–99)
Total CHOL/HDL Ratio: 3.1 Ratio
VLDL: 7 mg/dL (ref 0–40)

## 2013-01-17 LAB — HEPATIC FUNCTION PANEL
Albumin: 4.6 g/dL (ref 3.5–5.2)
Alkaline Phosphatase: 33 U/L — ABNORMAL LOW (ref 39–117)
Total Bilirubin: 0.5 mg/dL (ref 0.3–1.2)
Total Protein: 7.4 g/dL (ref 6.0–8.3)

## 2013-01-17 LAB — BASIC METABOLIC PANEL
BUN: 12 mg/dL (ref 6–23)
CO2: 29 mEq/L (ref 19–32)
Glucose, Bld: 75 mg/dL (ref 70–99)
Potassium: 3.7 mEq/L (ref 3.5–5.3)
Sodium: 139 mEq/L (ref 135–145)

## 2013-01-17 LAB — TSH: TSH: 0.796 u[IU]/mL (ref 0.350–4.500)

## 2013-01-19 ENCOUNTER — Encounter: Payer: Self-pay | Admitting: Internal Medicine

## 2013-01-19 ENCOUNTER — Ambulatory Visit (INDEPENDENT_AMBULATORY_CARE_PROVIDER_SITE_OTHER): Payer: Managed Care, Other (non HMO) | Admitting: Internal Medicine

## 2013-01-19 VITALS — BP 130/90 | HR 75 | Temp 98.4°F | Resp 20 | Ht 60.0 in | Wt 164.0 lb

## 2013-01-19 DIAGNOSIS — Z0000 Encounter for general adult medical examination without abnormal findings: Secondary | ICD-10-CM

## 2013-01-19 LAB — TB SKIN TEST: TB Skin Test: NEGATIVE

## 2013-01-19 NOTE — Progress Notes (Signed)
Subjective:    Patient ID: [name], female    DOB: [birth year], 52 y.o.   MRN: [id number]  HPI  52 year old patient who is seen today for a health maintenance examination. She enjoys excellent health and takes no chronic medications. She has no concerns or complaints today.  The health examination is prompted by requirements for a new job  Past Medical History  Diagnosis Date  . GALLSTONES 03/23/2010  . NONSPECIFIC ABNORM FIND RAD&OTH EXAM BILI TRACT 02/02/2008  . OVARIAN CYST 03/23/2010  . Female infertility   . History of rubella     As a child   . H/O varicella     As a child   . Abnormal Pap smear 2001  . Hx: UTI (urinary tract infection) 04/2003  . ANEMIA 03/23/2010  . AMA (advanced maternal age) multigravida 35+   . Ulcer 2001  . H/O constipation 2001  . Mild mitral valve prolapse   . Headache(784.0) 12/18/2006    Frequently  . Migraine   . H/O mumps   . Dyspareunia 2008    History   Social History  . Marital Status: Married    Spouse Name: N/A    Number of Children: N/A  . Years of Education: N/A   Occupational History  . Not on file.   Social History Main Topics  . Smoking status: Never Smoker   . Smokeless tobacco: Never Used  . Alcohol Use: No  . Drug Use: No  . Sexually Active: Yes    Birth Control/ Protection: None   Other Topics Concern  . Not on file   Social History Narrative  . No narrative on file    Past Surgical History  Procedure Laterality Date  . Laparoscopic ovarian cystectomy    . Cesarean section    . Laparoscopic ablation renal mass    . Wisdom tooth extraction      Family History  Problem Relation Age of Onset  . Hypertension Mother   . Stroke Mother   . Heart disease Father   . Hypertension Maternal Aunt   . Asthma Maternal Aunt   . Diabetes Maternal Aunt     Allergies  Allergen Reactions  . Sumatriptan Anaphylaxis  . Latex Itching  . Penicillins Hives and Swelling    Current Outpatient Prescriptions on File  Prior to Visit  Medication Sig Dispense Refill  . doxycycline (VIBRAMYCIN) 100 MG capsule Take 1 capsule (100 mg total) by mouth 2 (two) times daily. One po bid x 7 days  14 capsule  0  . loratadine (CLARITIN) 10 MG tablet Take 1 tablet (10 mg total) by mouth daily. One po daily x 14 days  14 tablet  0  . Lysine 1000 MG TABS Take by mouth.      . Multiple Vitamin (MULTIVITAMIN) tablet Take 1 tablet by mouth daily.        . norethindrone (MICRONOR,CAMILA,ERRIN) 0.35 MG tablet Take 1 tablet (0.35 mg total) by mouth daily.  1 Package  5   No current facility-administered medications on file prior to visit.    BP 130/90  Pulse 75  Temp(Src) 98.4 F (36.9 C) (Oral)  Resp 20  Ht 5' (1.524 m)  Wt 164 lb (74.39 kg)  BMI 32.03 kg/m2  SpO2 99%  LMP 01/05/2013     Review of Systems  Constitutional: Negative.   HENT: Negative for hearing loss, congestion, sore throat, rhinorrhea, dental problem, sinus pressure and tinnitus.   Eyes: Negative for pain, discharge and  visual disturbance.  Respiratory: Negative for cough and shortness of breath.   Cardiovascular: Negative for chest pain, palpitations and leg swelling.  Gastrointestinal: Negative for nausea, vomiting, abdominal pain, diarrhea, constipation, blood in stool and abdominal distention.  Genitourinary: Negative for dysuria, urgency, frequency, hematuria, flank pain, vaginal bleeding, vaginal discharge, difficulty urinating, vaginal pain and pelvic pain.  Musculoskeletal: Negative for joint swelling, arthralgias and gait problem.  Skin: Negative for rash.  Neurological: Negative for dizziness, syncope, speech difficulty, weakness, numbness and headaches.  Hematological: Negative for adenopathy.  Psychiatric/Behavioral: Negative for behavioral problems, dysphoric mood and agitation. The patient is not nervous/anxious.        Objective:   Physical Exam  Constitutional: She is oriented to person, place, and time. She appears  well-developed and well-nourished.  HENT:  Head: Normocephalic.  Right Ear: External ear normal.  Left Ear: External ear normal.  Mouth/Throat: Oropharynx is clear and moist.  Eyes: Conjunctivae and EOM are normal. Pupils are equal, round, and reactive to light.  Neck: Normal range of motion. Neck supple. No thyromegaly present.  Cardiovascular: Normal rate, regular rhythm, normal heart sounds and intact distal pulses.   Pulmonary/Chest: Effort normal and breath sounds normal.  Abdominal: Soft. Bowel sounds are normal. She exhibits no mass. There is no tenderness.  Musculoskeletal: Normal range of motion.  Lymphadenopathy:    She has no cervical adenopathy.  Neurological: She is alert and oriented to person, place, and time.  Skin: Skin is warm and dry. No rash noted.  Psychiatric: She has a normal mood and affect. Her behavior is normal.          Assessment & Plan:   Preventive health examination Mild obesity  Exercise weight loss encouraged Laboratory studies reviewed Forms were completed for employment  Return in one year or as needed

## 2013-01-19 NOTE — Patient Instructions (Addendum)
It is important that you exercise regularly, at least 20 minutes 3 to 4 times per week.  If you develop chest pain or shortness of breath seek  medical attention.  You need to lose weight.  Consider a lower calorie diet and regular exercise. 

## 2013-06-10 ENCOUNTER — Other Ambulatory Visit: Payer: Self-pay

## 2013-06-10 DIAGNOSIS — Z1231 Encounter for screening mammogram for malignant neoplasm of breast: Secondary | ICD-10-CM

## 2013-07-13 ENCOUNTER — Ambulatory Visit
Admission: RE | Admit: 2013-07-13 | Discharge: 2013-07-13 | Disposition: A | Discharge status: Home / Self Care | Payer: Managed Care, Other (non HMO) | Source: Ambulatory Visit

## 2013-07-13 DIAGNOSIS — Z1231 Encounter for screening mammogram for malignant neoplasm of breast: Secondary | ICD-10-CM

## 2013-07-17 ENCOUNTER — Other Ambulatory Visit: Payer: Self-pay | Admitting: Obstetrics and Gynecology

## 2013-07-17 DIAGNOSIS — R928 Other abnormal and inconclusive findings on diagnostic imaging of breast: Secondary | ICD-10-CM

## 2013-07-27 ENCOUNTER — Ambulatory Visit
Admission: RE | Admit: 2013-07-27 | Discharge: 2013-07-27 | Disposition: A | Discharge status: Home / Self Care | Payer: Managed Care, Other (non HMO) | Source: Ambulatory Visit | Attending: Obstetrics and Gynecology | Admitting: Obstetrics and Gynecology

## 2013-07-27 DIAGNOSIS — N6009 Solitary cyst of unspecified breast: Secondary | ICD-10-CM | POA: Insufficient documentation

## 2013-07-27 DIAGNOSIS — R928 Other abnormal and inconclusive findings on diagnostic imaging of breast: Secondary | ICD-10-CM

## 2013-08-10 ENCOUNTER — Telehealth: Payer: Self-pay | Admitting: *Deleted

## 2013-08-10 NOTE — Telephone Encounter (Signed)
Left message on voicemail to call office.  

## 2013-08-12 ENCOUNTER — Encounter: Payer: Self-pay | Admitting: Gastroenterology

## 2013-08-13 ENCOUNTER — Telehealth: Payer: Self-pay | Admitting: Internal Medicine

## 2013-08-13 NOTE — Telephone Encounter (Signed)
Spoke to Mr. [name] told him paperwork done, and I will fax to employer. Mr. [name] verbalized understanding. Paperwork faxed and original mailed to pt.

## 2013-08-13 NOTE — Telephone Encounter (Signed)
Pt needed paperwork turned in to his employer by tomorrow. 3/6.  Husband states they will charge him $50 late fee.  If/when it can be done. pls fax to: [phone]

## 2013-08-19 ENCOUNTER — Encounter: Payer: Self-pay | Admitting: Family Medicine

## 2013-08-19 ENCOUNTER — Ambulatory Visit (INDEPENDENT_AMBULATORY_CARE_PROVIDER_SITE_OTHER): Payer: Managed Care, Other (non HMO) | Admitting: Family Medicine

## 2013-08-19 VITALS — BP 130/84 | HR 76 | Temp 98.4°F | Wt 169.0 lb

## 2013-08-19 DIAGNOSIS — A499 Bacterial infection, unspecified: Secondary | ICD-10-CM

## 2013-08-19 DIAGNOSIS — B9689 Other specified bacterial agents as the cause of diseases classified elsewhere: Secondary | ICD-10-CM

## 2013-08-19 DIAGNOSIS — H1089 Other conjunctivitis: Secondary | ICD-10-CM

## 2013-08-19 DIAGNOSIS — H9201 Otalgia, right ear: Secondary | ICD-10-CM

## 2013-08-19 DIAGNOSIS — H109 Unspecified conjunctivitis: Secondary | ICD-10-CM

## 2013-08-19 DIAGNOSIS — H9209 Otalgia, unspecified ear: Secondary | ICD-10-CM

## 2013-08-19 MED ORDER — POLYMYXIN B-TRIMETHOPRIM 10000-0.1 UNIT/ML-% OP SOLN: 2.0000 [drp] | OPHTHALMIC | Status: DC

## 2013-08-19 NOTE — Patient Instructions (Signed)

## 2013-08-19 NOTE — Progress Notes (Signed)
Pre visit review using our clinic review tool, if applicable. No additional management support is needed unless otherwise documented below in the visit note. 

## 2013-08-19 NOTE — Progress Notes (Signed)
   Subjective:    Patient ID: [name], female    DOB: 05/13/1961, 53 y.o.   MRN: [id number]  Otalgia  Pertinent negatives include no ear discharge or hearing loss.   Acute visit. Patient seen with right ear pain for about 2 weeks and 1 day history of redness involving right eye with some crusted drainage this morning. She has not had any blurred vision. Minimal if any eye pain. She noted some type of irregularity in her contact lens yesterday and removed this at that time. She has not had any fever. No hearing changes. No ear drainage. No nasal congestion.  Patient teaches kindergarten but is not aware of any children with conjunctivitis recently  Past Medical History  Diagnosis Date  . GALLSTONES 03/23/2010  . NONSPECIFIC ABNORM FIND RAD&OTH EXAM BILI TRACT 02/02/2008  . OVARIAN CYST 03/23/2010  . Female infertility   . History of rubella     As a child   . H/O varicella     As a child   . Abnormal Pap smear 2001  . Hx: UTI (urinary tract infection) 04/2003  . ANEMIA 03/23/2010  . AMA (advanced maternal age) multigravida 35+   . Ulcer 2001  . H/O constipation 2001  . Mild mitral valve prolapse   . Headache(784.0) 12/18/2006    Frequently  . Migraine   . H/O mumps   . Dyspareunia 2008   Past Surgical History  Procedure Laterality Date  . Laparoscopic ovarian cystectomy    . Cesarean section    . Laparoscopic ablation renal mass    . Wisdom tooth extraction      reports that she has never smoked. She has never used smokeless tobacco. She reports that she does not drink alcohol or use illicit drugs. family history includes Asthma in her maternal aunt; Diabetes in her maternal aunt; Heart disease in her father; Hypertension in her maternal aunt and mother; Stroke in her mother. Allergies  Allergen Reactions  . Sumatriptan Anaphylaxis  . Latex Itching  . Penicillins Hives and Swelling      Review of Systems  Constitutional: Negative for fever and chills.  HENT:  Positive for ear pain. Negative for congestion, ear discharge and hearing loss.   Eyes: Positive for discharge, redness and itching. Negative for photophobia and visual disturbance.       Objective:   Physical Exam  Constitutional: She appears well-developed and well-nourished.  HENT:  Right Ear: External ear normal.  Left Ear: External ear normal.  Eyes: Pupils are equal, round, and reactive to light.  Right conjunctiva is minimally erythematous compared to left. No visible purulent drainage at this time. Fluoroscein staining reveals no foreign-body or any ulceration or corneal defects          Assessment & Plan:  #1 probable bacterial conjunctivitis right eye. Warm compresses. Polytrim ophthalmic drops every 2-4 hours while awake. #2 right otalgia with normal exam. Question barotitis media. Observe for now. Followup if symptoms persist. She will try Valsalva maneuver

## 2013-09-17 ENCOUNTER — Ambulatory Visit: Payer: Managed Care, Other (non HMO) | Admitting: Family Medicine

## 2013-09-28 ENCOUNTER — Ambulatory Visit: Payer: Managed Care, Other (non HMO) | Admitting: Gastroenterology

## 2013-11-12 ENCOUNTER — Other Ambulatory Visit: Payer: Self-pay | Admitting: Obstetrics and Gynecology

## 2013-11-12 DIAGNOSIS — R922 Inconclusive mammogram: Secondary | ICD-10-CM

## 2013-11-17 ENCOUNTER — Encounter: Payer: Self-pay | Admitting: Gastroenterology

## 2013-11-17 ENCOUNTER — Ambulatory Visit (INDEPENDENT_AMBULATORY_CARE_PROVIDER_SITE_OTHER): Payer: BC Managed Care – PPO | Admitting: Gastroenterology

## 2013-11-17 VITALS — BP 110/50 | HR 80 | Ht 60.25 in | Wt 172.0 lb

## 2013-11-17 DIAGNOSIS — K5900 Constipation, unspecified: Secondary | ICD-10-CM

## 2013-11-17 MED ORDER — MOVIPREP 100 G PO SOLR: 1.0000 | Freq: Once | ORAL | Status: DC

## 2013-11-17 NOTE — Progress Notes (Signed)
HPI: This is a    very pleasant 53 year old woman whom I last saw 8 or 9 years ago. That was around the time of gallstone related issues. She elected not to undergo cholecystectomy but is really felt better a lot of those pains that bothered her completely gone away.   Has nagging epigastric pain, constant pain.  this pain is reliably improved when she moves her bowels.  Will have BM twice per week, really has to push and strain.  + minor bleeding.  Blood on TP.  Overall weight is up 15 pounds in past year.  She has tried drinking prune juice, laxatives.  Has not tried fiber supplments or miralax.  Previously took lactulose and it helped.    Review of systems: Pertinent positive and negative review of systems were noted in the above HPI section. Complete review of systems was performed and was otherwise normal.    Past Medical History  Diagnosis Date  . GALLSTONES 03/23/2010  . NONSPECIFIC ABNORM FIND RAD&OTH EXAM BILI TRACT 02/02/2008  . OVARIAN CYST 03/23/2010  . Female infertility   . History of rubella     As a child   . H/O varicella     As a child   . Abnormal Pap smear 2001  . Hx: UTI (urinary tract infection) 04/2003  . ANEMIA 03/23/2010  . AMA (advanced maternal age) multigravida 35+   . Ulcer 2001  . H/O constipation 2001  . Mild mitral valve prolapse   . Headache(784.0) 12/18/2006    Frequently  . Migraine   . H/O mumps   . Dyspareunia 2008    Past Surgical History  Procedure Laterality Date  . Laparoscopic ovarian cystectomy    . Cesarean section      x 2  . Laparoscopic ablation renal mass    . Wisdom tooth extraction      Current Outpatient Prescriptions  Medication Sig Dispense Refill  . Lysine 1000 MG TABS Take by mouth.      . Multiple Vitamin (MULTIVITAMIN) tablet Take 1 tablet by mouth daily.        . norethindrone (MICRONOR,CAMILA,ERRIN) 0.35 MG tablet Take 1 tablet (0.35 mg total) by mouth daily.  1 Package  5  . valACYclovir (VALTREX)  1000 MG tablet Take 1,000 mg by mouth daily.        No current facility-administered medications for this visit.    Allergies as of 11/17/2013 - Review Complete 11/17/2013  Allergen Reaction Noted  . Imitrex [sumatriptan] Anaphylaxis 02/02/2008  . Latex Itching 02/02/2008  . Penicillins Hives and Swelling 02/02/2008    Family History  Problem Relation Age of Onset  . Hypertension Mother   . Heart disease Father   . Hypertension Maternal Aunt   . Asthma Maternal Aunt   . Diabetes Maternal Aunt     History   Social History  . Marital Status: Married    Spouse Name: N/A    Number of Children: 2  . Years of Education: N/A   Occupational History  .     Social History Main Topics  . Smoking status: Never Smoker   . Smokeless tobacco: Never Used  . Alcohol Use: No  . Drug Use: No  . Sexual Activity: Yes    Birth Control/ Protection: None   Other Topics Concern  . Not on file   Social History Narrative  . No narrative on file       Physical Exam: Ht 5' 0.25" (1.53 m)  Wt  172 lb (78.019 kg)  BMI 33.33 kg/m2 Constitutional: generally well-appearing Psychiatric: alert and oriented x3 Eyes: extraocular movements intact Mouth: oral pharynx moist, no lesions Neck: supple no lymphadenopathy Cardiovascular: heart regular rate and rhythm Lungs: clear to auscultation bilaterally Abdomen: soft, nontender, nondistended, no obvious ascites, no peritoneal signs, normal bowel sounds Extremities: no lower extremity edema bilaterally Skin: no lesions on visible extremities    Assessment and plan: 53 y.o. female with  chronic constipation, minor intermittent rectal bleeding, otherwise routine risk for colon cancer  I believe that her chronic constipation and minor associated rectal bleeding are likely functional. She has never tried fiber supplements which she will begin now. I will proceed with screening colonoscopy at her soonest convenience as well.

## 2013-11-17 NOTE — Patient Instructions (Signed)
Please start taking citrucel (orange flavored) powder fiber supplement.  This may cause some bloating at first but that usually goes away. Begin with a small spoonful and work your way up to a large, heaping spoonful daily over a week. You will be set up for a colonoscopy for screening.

## 2013-11-20 ENCOUNTER — Other Ambulatory Visit: Payer: Self-pay | Admitting: Obstetrics and Gynecology

## 2013-11-20 ENCOUNTER — Ambulatory Visit
Admission: RE | Admit: 2013-11-20 | Discharge: 2013-11-20 | Disposition: A | Discharge status: Home / Self Care | Payer: BC Managed Care – PPO | Source: Ambulatory Visit | Attending: Obstetrics and Gynecology | Admitting: Obstetrics and Gynecology

## 2013-11-20 DIAGNOSIS — R922 Inconclusive mammogram: Secondary | ICD-10-CM

## 2013-12-02 ENCOUNTER — Encounter: Payer: Self-pay | Admitting: Gastroenterology

## 2013-12-04 ENCOUNTER — Encounter: Payer: Self-pay | Admitting: Gastroenterology

## 2013-12-04 ENCOUNTER — Ambulatory Visit (AMBULATORY_SURGERY_CENTER): Payer: BC Managed Care – PPO | Admitting: Gastroenterology

## 2013-12-04 VITALS — BP 103/50 | HR 64 | Temp 97.2°F | Resp 20 | Ht 60.25 in | Wt 172.0 lb

## 2013-12-04 DIAGNOSIS — K5900 Constipation, unspecified: Secondary | ICD-10-CM

## 2013-12-04 DIAGNOSIS — Z1211 Encounter for screening for malignant neoplasm of colon: Secondary | ICD-10-CM

## 2013-12-04 MED ORDER — SODIUM CHLORIDE 0.9 % IV SOLN: 500.0000 mL | INTRAVENOUS | Status: DC

## 2013-12-04 NOTE — Op Note (Signed)
[facility] [address] [place], [id number]   COLONOSCOPY PROCEDURE REPORT  PATIENT: [name], [name]  MR#: [id number] BIRTHDATE: Aug 06, 1960 , 52  yrs. old GENDER: Female ENDOSCOPIST: Rachael Feeaniel P Jacobs, MD REFERRED AO:ZHYQMBY:Peter Amador CunasKwiatkowski, [name] PROCEDURE DATE:  12/04/2013 PROCEDURE:   Colonoscopy, screening First Screening Colonoscopy - Avg.  risk and is 50 yrs.  old or older Yes.  Prior Negative Screening - Now for repeat screening. N/A  History of Adenoma - Now for follow-up colonoscopy & has been > or = to 3 yrs.  N/A  Polyps Removed Today? No.  Recommend repeat exam, <10 yrs? No. ASA CLASS:   Class II INDICATIONS:average risk screening. MEDICATIONS: MAC sedation, administered by CRNA and Propofol (Diprivan) 230 mg IV  DESCRIPTION OF PROCEDURE:   After the risks benefits and alternatives of the procedure were thoroughly explained, informed consent was obtained.  A digital rectal exam revealed no abnormalities of the rectum.   The LB [id number]-[id number]  endoscope was introduced through the anus and advanced to the cecum, which was identified by both the appendix and ileocecal valve. No adverse events experienced.   The quality of the prep was excellent.  The instrument was then slowly withdrawn as the colon was fully examined.    COLON FINDINGS: A normal appearing cecum, ileocecal valve, and appendiceal orifice were identified.  The ascending, hepatic flexure, transverse, splenic flexure, descending, sigmoid colon and rectum appeared unremarkable.  No polyps or cancers were seen. Retroflexed views revealed no abnormalities. The time to cecum=3 minutes 57 seconds.  Withdrawal time=8 minutes 36 seconds.  The scope was withdrawn and the procedure completed. COMPLICATIONS: There were no complications.  ENDOSCOPIC IMPRESSION: Normal colon No polyps or cancers  RECOMMENDATIONS: You should continue to follow colorectal cancer screening guidelines for "routine  risk" patients with a repeat colonoscopy in 10 years.   eSigned:  Rachael Feeaniel P Jacobs, MD 12/04/2013 4:07 PM

## 2013-12-04 NOTE — Patient Instructions (Signed)
Discharge instructions given with verbal understanding. Normal exam. Resume previous medications. YOU HAD AN ENDOSCOPIC PROCEDURE TODAY AT THE [facility]: Refer to the procedure report that was given to you for any specific questions about what was found during the examination.  If the procedure report does not answer your questions, please call your gastroenterologist to clarify.  If you requested that your care partner not be given the details of your procedure findings, then the procedure report has been included in a sealed envelope for you to review at your convenience later.  YOU SHOULD EXPECT: Some feelings of bloating in the abdomen. Passage of more gas than usual.  Walking can help get rid of the air that was put into your GI tract during the procedure and reduce the bloating. If you had a lower endoscopy (such as a colonoscopy or flexible sigmoidoscopy) you may notice spotting of blood in your stool or on the toilet paper. If you underwent a bowel prep for your procedure, then you may not have a normal bowel movement for a few days.  DIET: Your first meal following the procedure should be a light meal and then it is ok to progress to your normal diet.  A half-sandwich or bowl of soup is an example of a good first meal.  Heavy or fried foods are harder to digest and may make you feel nauseous or bloated.  Likewise meals heavy in dairy and vegetables can cause extra gas to form and this can also increase the bloating.  Drink plenty of fluids but you should avoid alcoholic beverages for 24 hours.  ACTIVITY: Your care partner should take you home directly after the procedure.  You should plan to take it easy, moving slowly for the rest of the day.  You can resume normal activity the day after the procedure however you should NOT DRIVE or use heavy machinery for 24 hours (because of the sedation medicines used during the test).    SYMPTOMS TO REPORT IMMEDIATELY: A gastroenterologist  can be reached at any hour.  During normal business hours, 8:30 AM to 5:00 PM Monday through Friday, call [phone].  After hours and on weekends, please call the GI answering service at [phone] who will take a message and have the physician on call contact you.   Following lower endoscopy (colonoscopy or flexible sigmoidoscopy):  Excessive amounts of blood in the stool  Significant tenderness or worsening of abdominal pains  Swelling of the abdomen that is new, acute  Fever of 100F or higher  FOLLOW UP: If any biopsies were taken you will be contacted by phone or by letter within the next 1-3 weeks.  Call your gastroenterologist if you have not heard about the biopsies in 3 weeks.  Our staff will call the home number listed on your records the next business day following your procedure to check on you and address any questions or concerns that you may have at that time regarding the information given to you following your procedure. This is a courtesy call and so if there is no answer at the home number and we have not heard from you through the emergency physician on call, we will assume that you have returned to your regular daily activities without incident.  SIGNATURES/CONFIDENTIALITY: You and/or your care partner have signed paperwork which will be entered into your electronic medical record.  These signatures attest to the fact that that the information above on your After Visit Summary has been reviewed   and is understood.  Full responsibility of the confidentiality of this discharge information lies with you and/or your care-partner. 

## 2013-12-04 NOTE — Progress Notes (Signed)
Procedure ends, to recovery, report given and VSS. 

## 2013-12-07 ENCOUNTER — Telehealth: Payer: Self-pay

## 2013-12-07 NOTE — Telephone Encounter (Signed)
Left message on answering machine. 

## 2014-01-06 ENCOUNTER — Encounter: Payer: Self-pay | Admitting: Internal Medicine

## 2014-01-11 ENCOUNTER — Ambulatory Visit (INDEPENDENT_AMBULATORY_CARE_PROVIDER_SITE_OTHER): Payer: BC Managed Care – PPO | Admitting: Family Medicine

## 2014-01-11 ENCOUNTER — Encounter: Payer: Self-pay | Admitting: Family Medicine

## 2014-01-11 VITALS — BP 140/90 | HR 72 | Temp 98.1°F | Wt 173.0 lb

## 2014-01-11 DIAGNOSIS — R51 Headache: Secondary | ICD-10-CM

## 2014-01-11 LAB — POCT URINE PREGNANCY: Preg Test, Ur: NEGATIVE

## 2014-01-11 MED ORDER — ONDANSETRON 4 MG PO TBDP: 4.0000 mg | ORAL_TABLET | Freq: Three times a day (TID) | ORAL | Status: DC | PRN

## 2014-01-11 MED ORDER — KETOROLAC TROMETHAMINE 60 MG/2ML IM SOLN
30.0000 mg | Freq: Once | INTRAMUSCULAR | Status: AC
  Administered 2014-01-11: 30 mg via INTRAMUSCULAR

## 2014-01-11 MED ORDER — METHYLPREDNISOLONE ACETATE 80 MG/ML IJ SUSP
80.0000 mg | Freq: Once | INTRAMUSCULAR | Status: AC
  Administered 2014-01-11: 80 mg via INTRAMUSCULAR

## 2014-01-11 NOTE — Assessment & Plan Note (Signed)
Suspect this is a migraine that resulted from a tension headache. Treated with migraine cocktail including Toradol 30 mg as well as Depo-Medrol 80 mg both IM. Had plan for promethazine but patient has 30 minute drive so sent in Zofran to be used for nausea.  The burning in the neck is odd but I do not believe it reflects cervical disc disease. She is afebrile and well appearing so I doubt meningitis or infectious cause.

## 2014-01-11 NOTE — Patient Instructions (Signed)
Migraine cocktail today for what I believe is a migraine.  Continue massage on neck (think tension headache may have triggered this/be going on at same time) See us back on Wednesday if not improving or sooner if worsening or if febrile/sick overall

## 2014-01-11 NOTE — Progress Notes (Signed)
Tana Conch, MD Phone: [phone]  Subjective:   [name] is a 53 y.o. year old very pleasant female patient who presents with the following:  Headache 48 hours of pain. across forehead and down into neck and across shoulders. Pain 10/10. Burning in neck. Pain in head a constant ache/squeeze. Debilitating and unable to sleep. Had been on topamax for migraine prophylaxis about 2 years ago but stopped taking them at that time. Has not reported any migraines since that time. Patient admits the current headache is very similar to her prior migraines except for burning in her neck. Admits to nausea but no vomiting. Does occasionally get headaches that last short term with help of excedrin, advil, aleve. This time has tried aleve, advil migraine, excedrin and tylenol. Took up to 800 mg ibuprofen. 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 She admits to some increased stress. Denies recent sick contacts. ROS- no fever/chills. No recent cold/congestion issues. No neck stiffness. Denies paresthesias in arms or shoulders.  Past Medical History-anemia. History of migraines. Denies history of cervical disc disease or trauma to the neck.  Medications- reviewed and updated Current Outpatient Prescriptions  Medication Sig Dispense Refill  . Lysine 1000 MG TABS Take by mouth.      . Naproxen Sodium (ALEVE PO) Take by mouth as needed.      . valACYclovir (VALTREX) 1000 MG tablet Take 1,000 mg by mouth daily.       . Multiple Vitamin (MULTIVITAMIN) tablet Take 1 tablet by mouth daily.        . ondansetron (ZOFRAN-ODT) 4 MG disintegrating tablet Take 1 tablet (4 mg total) by mouth every 8 (eight) hours as needed for nausea or vomiting.  20 tablet  0   No current facility-administered medications for this visit.    Objective: BP 140/90  Pulse 72  Temp(Src) 98.1 F (36.7 C)  Wt 173 lb (78.472 kg) Gen: NAD, resting comfortably sitting on bed HEENT:  turbinates normal, oropharynx normal without pharyngeal exudate, TM normal bilaterally, Mucous membranes are moist. NCAT. PERRLA. No sinus tendrness Neck: no lymphadenopathy, some muscle tension but no neck stiffness.  CV: RRR no mrg  Lungs: CTAB  Abd: soft/nontender/nondistended/normal bowel sounds  MSK: moves all extremities, no edema  Skin: warm and dry, no rash  Neuro: CN II-XII intact, sensation and reflexes normal throughout, 5/5 muscle strength in bilateral upper and lower extremities. Normal finger to nose. Normal rapid alternating movements. Normal gait. Normal romberg.   Assessment/Plan:  Headache(784.0) Suspect this is a migraine that resulted from a tension headache. Treated with migraine cocktail including Toradol 30 mg as well as Depo-Medrol 80 mg both IM. Had plan for promethazine but patient has 30 minute drive so sent in Zofran to be used for nausea.  The burning in the neck is odd but I do not believe it reflects cervical disc disease. She is afebrile and well appearing so I doubt meningitis or infectious cause.  Urine pregnancy was checked and was negative. Advised to followup in 2 days if no improvement or sooner if worsens. Hopeful patient does not have to go back onto migraine prophylaxis.  Meds ordered this encounter  Medications  . ondansetron (ZOFRAN-ODT) 4 MG disintegrating tablet    Sig: Take 1 tablet (4 mg total) by mouth every 8 (eight) hours as needed for nausea or vomiting.    Dispense:  20 tablet    Refill:  0  . ketorolac (TORADOL) injection 30 mg  Sig:   . methylPREDNISolone acetate (DEPO-MEDROL) injection 80 mg    Sig:

## 2014-01-12 ENCOUNTER — Encounter: Payer: Self-pay | Admitting: Internal Medicine

## 2014-01-12 ENCOUNTER — Ambulatory Visit (INDEPENDENT_AMBULATORY_CARE_PROVIDER_SITE_OTHER): Payer: BC Managed Care – PPO | Admitting: Internal Medicine

## 2014-01-12 VITALS — BP 120/80 | HR 73 | Temp 98.1°F | Resp 20 | Ht 60.25 in | Wt 173.0 lb

## 2014-01-12 DIAGNOSIS — R51 Headache: Secondary | ICD-10-CM

## 2014-01-12 MED ORDER — TOPIRAMATE 25 MG PO CPSP: 25.0000 mg | ORAL_CAPSULE | Freq: Two times a day (BID) | ORAL | Status: DC

## 2014-01-12 MED ORDER — TOPIRAMATE 50 MG PO TABS: 50.0000 mg | ORAL_TABLET | Freq: Two times a day (BID) | ORAL | Status: DC

## 2014-01-12 MED ORDER — METOCLOPRAMIDE HCL 10 MG PO TABS: ORAL_TABLET | ORAL | Status: DC

## 2014-01-12 MED ORDER — ONDANSETRON 4 MG PO TBDP: 4.0000 mg | ORAL_TABLET | Freq: Three times a day (TID) | ORAL | Status: DC | PRN

## 2014-01-12 NOTE — Progress Notes (Signed)
Subjective:    Patient ID: [name], female    DOB: 11/06/1960, 53 y.o.   MRN: [id number]  HPI 53 year old patient who has a history of migraine headaches, who presents with 3 days of persistent, intractable headaches.  She was seen in the office yesterday and treated with parenteral Toradol and Depo-Medrol.  Headache , decreased and the patient was able to sleep through the night.  She will this morning with persistent mild headache that has progressively worsened throughout the day.  She has been using Aleve and Zofran.  She has a history of tryptan allergy.  She has been on Topamax in the past for prevention. 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 GALLSTONES 03/23/2010  . NONSPECIFIC ABNORM FIND RAD&OTH EXAM BILI TRACT 02/02/2008  . OVARIAN CYST 03/23/2010  . Female infertility   . History of rubella     As a child   . H/O varicella     As a child   . Abnormal Pap smear 2001  . Hx: UTI (urinary tract infection) 04/2003  . ANEMIA 03/23/2010  . AMA (advanced maternal age) multigravida 35+   . Ulcer 2001  . H/O constipation 2001  . Mild mitral valve prolapse   . Headache(784.0) 12/18/2006    Frequently  . Migraine   . H/O mumps   . Dyspareunia 2008  . Heart murmur     History   Social History  . Marital Status: Married    Spouse Name: N/A    Number of Children: 2  . Years of Education: N/A   Occupational History  .     Social History Main Topics  . Smoking status: Never Smoker   . Smokeless tobacco: Never Used  . Alcohol Use: No  . Drug Use: No  . Sexual Activity: Yes    Birth Control/ Protection: Pill   Other Topics Concern  . Not on file   Social History Narrative  . No narrative on file    Past Surgical History  Procedure Laterality Date  . Laparoscopic ovarian cystectomy    . Cesarean section      x 2  . Laparoscopic ablation renal mass     . Wisdom tooth extraction      Family History  Problem Relation Age of Onset  . Hypertension Mother   . Heart disease Father   . Hypertension Maternal Aunt   . Asthma Maternal Aunt   . Diabetes Maternal Aunt   . Colon cancer Neg Hx   . Esophageal cancer Neg Hx   . Stomach cancer Neg Hx   . Rectal cancer Neg Hx     Allergies  Allergen Reactions  . Imitrex [Sumatriptan] Anaphylaxis  . Latex Itching  . Penicillins Hives and Swelling    Current Outpatient Prescriptions on File Prior to Visit  Medication Sig Dispense Refill  . Lysine 1000 MG TABS Take by mouth.      . Multiple Vitamin (MULTIVITAMIN) tablet Take 1 tablet by mouth daily.        . Naproxen Sodium (ALEVE PO) Take by mouth as needed.      . valACYclovir (VALTREX) 1000 MG tablet Take 1,000 mg by mouth daily.        No current facility-administered medications on file prior to visit.    BP 120/80  Pulse 73  Temp(Src) 98.1 F (36.7 C) (Oral)  Resp 20  Ht 5' 0.25" (1.53 m)  Wt 173 lb (78.472 kg)  BMI 33.52 kg/m2  SpO2 97%       Review of Systems  Constitutional: Negative.   HENT: Negative for congestion, dental problem, hearing loss, rhinorrhea, sinus pressure, sore throat and tinnitus.   Eyes: Positive for photophobia. Negative for pain, discharge and visual disturbance.  Respiratory: Negative for cough and shortness of breath.   Cardiovascular: Negative for chest pain, palpitations and leg swelling.  Gastrointestinal: Positive for nausea. Negative for vomiting, abdominal pain, diarrhea, constipation, blood in stool and abdominal distention.  Genitourinary: Negative for dysuria, urgency, frequency, hematuria, flank pain, vaginal bleeding, vaginal discharge, difficulty urinating, vaginal pain and pelvic pain.  Musculoskeletal: Negative for arthralgias, gait problem and joint swelling.  Skin: Negative for rash.  Neurological: Positive for headaches. Negative for dizziness, syncope, speech difficulty,  weakness and numbness.  Hematological: Negative for adenopathy.  Psychiatric/Behavioral: Negative for behavioral problems, dysphoric mood and agitation. The patient is not nervous/anxious.        Objective:   Physical Exam  Constitutional: She is oriented to person, place, and time. She appears well-developed and well-nourished.  Appears fairly comfortable Accompanied by 2 younger children Normal blood pressure and pulse  HENT:  Head: Normocephalic.  Right Ear: External ear normal.  Left Ear: External ear normal.  Mouth/Throat: Oropharynx is clear and moist.  Eyes: Conjunctivae and EOM are normal. Pupils are equal, round, and reactive to light.  Neck: Normal range of motion. Neck supple. No thyromegaly present.  Cardiovascular: Normal rate, regular rhythm, normal heart sounds and intact distal pulses.   Pulmonary/Chest: Effort normal and breath sounds normal.  Abdominal: Soft. Bowel sounds are normal. She exhibits no mass. There is no tenderness.  Musculoskeletal: Normal range of motion.  Lymphadenopathy:    She has no cervical adenopathy.  Neurological: She is alert and oriented to person, place, and time.  Skin: Skin is warm and dry. No rash noted.  Psychiatric: She has a normal mood and affect. Her behavior is normal.          Assessment & Plan:  Status migrainosus.  The patient is allergic to Triptans.  Will resume Topamax, which has been quite helpful in the past.  We'll treat with a regimen of Excedrin Migraine with Zofran and metoclopramide. Patient will call tomorrow if unimproved.  Consider neurology evaluation for parenteral DHE , and metoclopramide, and possibly repeat IV Decadron

## 2014-01-12 NOTE — Patient Instructions (Addendum)
Excedrin migraine every 6 hours  zofran every 6 hours   topamax as dispensed- 25 mg twice daily for one week, then 25 mg in the morning and 50 mg at bedtime for one week, then 50 mg twice daily

## 2014-01-12 NOTE — Progress Notes (Signed)
Pre visit review using our clinic review tool, if applicable. No additional management support is needed unless otherwise documented below in the visit note. 

## 2014-04-12 ENCOUNTER — Encounter: Payer: Self-pay | Admitting: Internal Medicine

## 2014-05-28 ENCOUNTER — Other Ambulatory Visit: Payer: Self-pay | Admitting: Internal Medicine

## 2014-07-05 ENCOUNTER — Other Ambulatory Visit: Payer: Self-pay

## 2014-07-05 DIAGNOSIS — Z1231 Encounter for screening mammogram for malignant neoplasm of breast: Secondary | ICD-10-CM

## 2014-07-19 ENCOUNTER — Ambulatory Visit: Payer: Self-pay

## 2014-07-26 ENCOUNTER — Ambulatory Visit: Payer: Self-pay

## 2014-09-06 ENCOUNTER — Ambulatory Visit
Admission: RE | Admit: 2014-09-06 | Discharge: 2014-09-06 | Disposition: A | Discharge status: Home / Self Care | Payer: BLUE CROSS/BLUE SHIELD | Source: Ambulatory Visit

## 2014-09-06 DIAGNOSIS — Z1231 Encounter for screening mammogram for malignant neoplasm of breast: Secondary | ICD-10-CM

## 2014-09-11 ENCOUNTER — Other Ambulatory Visit: Payer: Self-pay | Admitting: Internal Medicine

## 2014-11-24 LAB — HM PAP SMEAR

## 2014-12-11 ENCOUNTER — Other Ambulatory Visit: Payer: Self-pay | Admitting: Internal Medicine

## 2015-01-17 ENCOUNTER — Ambulatory Visit (INDEPENDENT_AMBULATORY_CARE_PROVIDER_SITE_OTHER): Payer: BLUE CROSS/BLUE SHIELD | Admitting: Internal Medicine

## 2015-01-17 ENCOUNTER — Encounter: Payer: Self-pay | Admitting: Internal Medicine

## 2015-01-17 VITALS — BP 120/80 | HR 68 | Temp 98.1°F | Resp 20 | Ht 60.25 in | Wt 155.0 lb

## 2015-01-17 DIAGNOSIS — R519 Headache, unspecified: Secondary | ICD-10-CM

## 2015-01-17 DIAGNOSIS — R5383 Other fatigue: Secondary | ICD-10-CM

## 2015-01-17 DIAGNOSIS — R42 Dizziness and giddiness: Secondary | ICD-10-CM

## 2015-01-17 DIAGNOSIS — H8110 Benign paroxysmal vertigo, unspecified ear: Secondary | ICD-10-CM | POA: Diagnosis not present

## 2015-01-17 DIAGNOSIS — R51 Headache: Secondary | ICD-10-CM

## 2015-01-17 LAB — POCT GLUCOSE (DEVICE FOR HOME USE): POC Glucose: 99 mg/dl (ref 70–99)

## 2015-01-17 MED ORDER — PROMETHAZINE HCL 25 MG PO TABS: 25.0000 mg | ORAL_TABLET | Freq: Three times a day (TID) | ORAL | Status: DC | PRN

## 2015-01-17 NOTE — Progress Notes (Signed)
Subjective:    Patient ID: [name], female    DOB: 1960/10/05, 54 y.o.   MRN: [id number]  HPI  54 year old patient who presents with a one-month history of dizziness.  She describes a spinning sensation that is worse with change in posture , especially bending over.  She describes a spinning sensation and loss of balance.  She has some associated headache but no nausea.  Symptoms have been present for about the 4 weeks.  No new medications She has a history of headaches and has been on Topamax for prophylaxis.  Past Medical History  Diagnosis Date  . GALLSTONES 03/23/2010  . NONSPECIFIC ABNORM FIND RAD&OTH EXAM BILI TRACT 02/02/2008  . OVARIAN CYST 03/23/2010  . Female infertility   . History of rubella     As a child   . H/O varicella     As a child   . Abnormal Pap smear 2001  . Hx: UTI (urinary tract infection) 04/2003  . ANEMIA 03/23/2010  . AMA (advanced maternal age) multigravida 35+   . Ulcer 2001  . H/O constipation 2001  . Mild mitral valve prolapse   . Headache(784.0) 12/18/2006    Frequently  . Migraine   . H/O mumps   . Dyspareunia 2008  . Heart murmur     History   Social History  . Marital Status: Married    Spouse Name: N/A  . Number of Children: 2  . Years of Education: N/A   Occupational History  .     Social History Main Topics  . Smoking status: Never Smoker   . Smokeless tobacco: Never Used  . Alcohol Use: No  . Drug Use: No  . Sexual Activity: Yes    Birth Control/ Protection: Pill   Other Topics Concern  . Not on file   Social History Narrative    Past Surgical History  Procedure Laterality Date  . Laparoscopic ovarian cystectomy    . Cesarean section      x 2  . Laparoscopic ablation renal mass    . Wisdom tooth extraction      Family History  Problem Relation Age of Onset  . Hypertension Mother   . Heart disease Father   . Hypertension Maternal Aunt   . Asthma Maternal Aunt   . Diabetes Maternal Aunt   . Colon  cancer Neg Hx   . Esophageal cancer Neg Hx   . Stomach cancer Neg Hx   . Rectal cancer Neg Hx     Allergies  Allergen Reactions  . Imitrex [Sumatriptan] Anaphylaxis  . Latex Itching  . Penicillins Hives and Swelling    Current Outpatient Prescriptions on File Prior to Visit  Medication Sig Dispense Refill  . Lysine 1000 MG TABS Take by mouth.    . metoCLOPramide (REGLAN) 10 MG tablet 2 tablets initially then one every 6 hours as needed 20 tablet 0  . Multiple Vitamin (MULTIVITAMIN) tablet Take 1 tablet by mouth daily.      . Naproxen Sodium (ALEVE PO) Take by mouth as needed.    . topiramate (TOPAMAX) 50 MG tablet TAKE 1 TABLET BY MOUTH TWICE A DAY 60 tablet 2  . valACYclovir (VALTREX) 1000 MG tablet Take 1,000 mg by mouth daily.      No current facility-administered medications on file prior to visit.    BP 120/80 mmHg  Pulse 68  Temp(Src) 98.1 F (36.7 C) (Oral)  Resp 20  Ht 5' 0.25" (1.53 m)  Wt 155  lb (70.308 kg)  BMI 30.03 kg/m2  SpO2 98%     Review of Systems  Constitutional: Negative.   HENT: Negative for congestion, dental problem, hearing loss, rhinorrhea, sinus pressure, sore throat and tinnitus.   Eyes: Negative for pain, discharge and visual disturbance.  Respiratory: Negative for cough and shortness of breath.   Cardiovascular: Negative for chest pain, palpitations and leg swelling.  Gastrointestinal: Negative for nausea, vomiting, abdominal pain, diarrhea, constipation, blood in stool and abdominal distention.  Genitourinary: Negative for dysuria, urgency, frequency, hematuria, flank pain, vaginal bleeding, vaginal discharge, difficulty urinating, vaginal pain and pelvic pain.  Musculoskeletal: Negative for joint swelling, arthralgias and gait problem.  Skin: Negative for rash.  Neurological: Positive for dizziness and light-headedness. Negative for syncope, speech difficulty, weakness, numbness and headaches.  Hematological: Negative for adenopathy.    Psychiatric/Behavioral: Negative for behavioral problems, dysphoric mood and agitation. The patient is not nervous/anxious.        Objective:   Physical Exam  Constitutional: She is oriented to person, place, and time. She appears well-developed and well-nourished.  HENT:  Head: Normocephalic.  Right Ear: External ear normal.  Left Ear: External ear normal.  Mouth/Throat: Oropharynx is clear and moist.  Eyes: Conjunctivae and EOM are normal. Pupils are equal, round, and reactive to light.  Neck: Normal range of motion. Neck supple. No thyromegaly present.  Cardiovascular: Normal rate, regular rhythm, normal heart sounds and intact distal pulses.   Pulmonary/Chest: Effort normal and breath sounds normal.  Abdominal: Soft. Bowel sounds are normal. She exhibits no mass. There is no tenderness.  Musculoskeletal: Normal range of motion.  Lymphadenopathy:    She has no cervical adenopathy.  Neurological: She is alert and oriented to person, place, and time.  Alert and oriented Pupils equal and reactive to light Extraocular muscles full Normal finger to nose testing Normal gait  Skin: Skin is warm and dry. No rash noted.  Psychiatric: She has a normal mood and affect. Her behavior is normal.          Assessment & Plan:   Positional vertigo.  Will place on a nonsedating antihistamine.  Use Phenergan when necessary patient given information concerning repositioning maneuvers Will call if unimproved Chronic headache, stable

## 2015-01-17 NOTE — Progress Notes (Signed)
Pre visit review using our clinic review tool, if applicable. No additional management support is needed unless otherwise documented below in the visit note. 

## 2015-01-17 NOTE — Patient Instructions (Signed)
Call or return to clinic prn if these symptoms worsen or fail to improve as anticipated.  Benign Positional Vertigo Vertigo means you feel like you or your surroundings are moving when they are not. Benign positional vertigo is the most common form of vertigo. Benign means that the cause of your condition is not serious. Benign positional vertigo is more common in older adults. CAUSES  Benign positional vertigo is the result of an upset in the labyrinth system. This is an area in the middle ear that helps control your balance. This may be caused by a viral infection, head injury, or repetitive motion. However, often no specific cause is found. SYMPTOMS  Symptoms of benign positional vertigo occur when you move your head or eyes in different directions. Some of the symptoms may include:  Loss of balance and falls.  Vomiting.  Blurred vision.  Dizziness.  Nausea.  Involuntary eye movements (nystagmus). DIAGNOSIS  Benign positional vertigo is usually diagnosed by physical exam. If the specific cause of your benign positional vertigo is unknown, your caregiver may perform imaging tests, such as magnetic resonance imaging (MRI) or computed tomography (CT). TREATMENT  Your caregiver may recommend movements or procedures to correct the benign positional vertigo. Medicines such as meclizine, benzodiazepines, and medicines for nausea may be used to treat your symptoms. In rare cases, if your symptoms are caused by certain conditions that affect the inner ear, you may need surgery. HOME CARE INSTRUCTIONS   Follow your caregiver's instructions.  Move slowly. Do not make sudden body or head movements.  Avoid driving.  Avoid operating heavy machinery.  Avoid performing any tasks that would be dangerous to you or others during a vertigo episode.  Drink enough fluids to keep your urine clear or pale yellow. SEEK IMMEDIATE MEDICAL CARE IF:   You develop problems with walking, weakness,  numbness, or using your arms, hands, or legs.  You have difficulty speaking.  You develop severe headaches.  Your nausea or vomiting continues or gets worse.  You develop visual changes.  Your family or friends notice any behavioral changes.  Your condition gets worse.  You have a fever.  You develop a stiff neck or sensitivity to light. MAKE SURE YOU:   Understand these instructions.  Will watch your condition.  Will get help right away if you are not doing well or get worse. Document Released: 03/05/2006 Document Revised: 08/20/2011 Document Reviewed: 02/15/2011 [employer] Patient Information 2015 [employer]. This information is not intended to replace advice given to you by your health care provider. Make sure you discuss any questions you have with your health care provider.  

## 2015-01-24 ENCOUNTER — Telehealth: Payer: Self-pay | Admitting: Internal Medicine

## 2015-01-24 DIAGNOSIS — R51 Headache: Secondary | ICD-10-CM

## 2015-01-24 DIAGNOSIS — R519 Headache, unspecified: Secondary | ICD-10-CM

## 2015-01-24 DIAGNOSIS — R42 Dizziness and giddiness: Secondary | ICD-10-CM

## 2015-01-24 NOTE — Telephone Encounter (Signed)
Spoke to pt, told her order for referral to Neuro was done and someone will contact you regarding an appointment. Pt verbalized understanding.

## 2015-01-24 NOTE — Telephone Encounter (Signed)
Please see message and advise 

## 2015-01-24 NOTE — Telephone Encounter (Signed)
Pt seen 8/8 and dx w/ vertigo.  Pt does not believe this is the correct dx. Due to still have some imbalance issues w/in her head. Pt describes as "loosing H2O".  Pt states when the feeling happens, she starts slowing down as though in slow motion and  pt head begins to hurt.  Comes form the top back of her head.  Pt had an episode yesterday.  Pt is thinking maybe a referral to a neurologist. Poss nerve issue. Pt would like a referral to neuro.

## 2015-01-24 NOTE — Telephone Encounter (Signed)
Ok for Neuro referral

## 2015-01-27 ENCOUNTER — Encounter (HOSPITAL_BASED_OUTPATIENT_CLINIC_OR_DEPARTMENT_OTHER): Payer: Self-pay | Admitting: *Deleted

## 2015-01-27 ENCOUNTER — Emergency Department (HOSPITAL_BASED_OUTPATIENT_CLINIC_OR_DEPARTMENT_OTHER): Payer: BLUE CROSS/BLUE SHIELD

## 2015-01-27 ENCOUNTER — Emergency Department (HOSPITAL_BASED_OUTPATIENT_CLINIC_OR_DEPARTMENT_OTHER)
Admission: EM | Admit: 2015-01-27 | Discharge: 2015-01-27 | Disposition: A | Discharge status: Home / Self Care | Payer: BLUE CROSS/BLUE SHIELD | Attending: Emergency Medicine | Admitting: Emergency Medicine

## 2015-01-27 DIAGNOSIS — R011 Cardiac murmur, unspecified: Secondary | ICD-10-CM | POA: Insufficient documentation

## 2015-01-27 DIAGNOSIS — Z3202 Encounter for pregnancy test, result negative: Secondary | ICD-10-CM | POA: Insufficient documentation

## 2015-01-27 DIAGNOSIS — Z91040 Latex allergy status: Secondary | ICD-10-CM | POA: Insufficient documentation

## 2015-01-27 DIAGNOSIS — Z8679 Personal history of other diseases of the circulatory system: Secondary | ICD-10-CM | POA: Insufficient documentation

## 2015-01-27 DIAGNOSIS — Z8619 Personal history of other infectious and parasitic diseases: Secondary | ICD-10-CM | POA: Insufficient documentation

## 2015-01-27 DIAGNOSIS — Z79899 Other long term (current) drug therapy: Secondary | ICD-10-CM | POA: Insufficient documentation

## 2015-01-27 DIAGNOSIS — Z8742 Personal history of other diseases of the female genital tract: Secondary | ICD-10-CM | POA: Diagnosis not present

## 2015-01-27 DIAGNOSIS — G44209 Tension-type headache, unspecified, not intractable: Secondary | ICD-10-CM | POA: Insufficient documentation

## 2015-01-27 DIAGNOSIS — Z87440 Personal history of urinary (tract) infections: Secondary | ICD-10-CM | POA: Insufficient documentation

## 2015-01-27 DIAGNOSIS — R51 Headache: Secondary | ICD-10-CM | POA: Diagnosis present

## 2015-01-27 DIAGNOSIS — Z862 Personal history of diseases of the blood and blood-forming organs and certain disorders involving the immune mechanism: Secondary | ICD-10-CM | POA: Diagnosis not present

## 2015-01-27 DIAGNOSIS — Z8719 Personal history of other diseases of the digestive system: Secondary | ICD-10-CM | POA: Diagnosis not present

## 2015-01-27 DIAGNOSIS — Z880 Allergy status to penicillin: Secondary | ICD-10-CM | POA: Insufficient documentation

## 2015-01-27 DIAGNOSIS — M5481 Occipital neuralgia: Secondary | ICD-10-CM | POA: Diagnosis not present

## 2015-01-27 LAB — PREGNANCY, URINE: PREG TEST UR: NEGATIVE

## 2015-01-27 MED ORDER — NAPROXEN 500 MG PO TABS: 500.0000 mg | ORAL_TABLET | Freq: Two times a day (BID) | ORAL | Status: DC

## 2015-01-27 MED ORDER — METHOCARBAMOL 500 MG PO TABS: 500.0000 mg | ORAL_TABLET | Freq: Two times a day (BID) | ORAL | Status: DC

## 2015-01-27 MED ORDER — GABAPENTIN 100 MG PO CAPS: 100.0000 mg | ORAL_CAPSULE | Freq: Two times a day (BID) | ORAL | Status: DC

## 2015-01-27 NOTE — ED Notes (Signed)
Pt amb back to room with quick steady gait in nad. Pt reports being seen by her family doctor twice last week for ringing in her ears, and intermittent dizzy feelings, pt states her doctor dx her with vertigo. Pt states 'I just don't think that is what is happening, that was a misdiagnosis, I think I have an aneurysm that is leaking or something." pt states her head feels tender to touch also. Pt states she tried to do the vertigo exersises, but was not able to do them due to nausea.

## 2015-01-27 NOTE — ED Provider Notes (Signed)
CSN: [id number]     Arrival date & time 01/27/15  0827 History   First MD Initiated Contact with Patient 01/27/15 [phone]     Chief Complaint  Patient presents with  . Headache      HPI  Patient presents for evaluation of the posterior headache. States she's had this for 3-4 weeks. Describes it as "burning feeling". Starts at the base of her skull or top of her neck. Radiates into her head and somewhat towards the left of her neck. No symptoms past the shoulder. Does not have electrical shock sort of feelings in the arms or down the spine. No numbness weakness or disuse of her extremities.  She states that she is worried she has a "leaking aneurysm". States she has an uncle that "just died" of a "leaking aneurysm". She has no thunderclap-type events. Symptoms been present for the greater than 3 weeks. In seen twice by her primary care physician as she states that she thinks he is "just guessing" she's been given migraine medication and has offered her no improvement.  Was asked to do simple vertigo exercises" but states that she at times will get nausea. She denies if she has any sensation of movement with her head or frank vertigo symptoms.   Past Medical History  Diagnosis Date  . GALLSTONES 03/23/2010  . NONSPECIFIC ABNORM FIND RAD&OTH EXAM BILI TRACT 02/02/2008  . OVARIAN CYST 03/23/2010  . Female infertility   . History of rubella     As a child   . H/O varicella     As a child   . Abnormal Pap smear 2001  . Hx: UTI (urinary tract infection) 04/2003  . ANEMIA 03/23/2010  . AMA (advanced maternal age) multigravida 35+   . Ulcer 2001  . H/O constipation 2001  . Mild mitral valve prolapse   . Headache(784.0) 12/18/2006    Frequently  . Migraine   . H/O mumps   . Dyspareunia 2008  . Heart murmur    Past Surgical History  Procedure Laterality Date  . Laparoscopic ovarian cystectomy    . Cesarean section      x 2  . Laparoscopic ablation renal mass    . Wisdom tooth extraction      Family History  Problem Relation Age of Onset  . Hypertension Mother   . Heart disease Father   . Hypertension Maternal Aunt   . Asthma Maternal Aunt   . Diabetes Maternal Aunt   . Colon cancer Neg Hx   . Esophageal cancer Neg Hx   . Stomach cancer Neg Hx   . Rectal cancer Neg Hx    Social History  Substance Use Topics  . Smoking status: Never Smoker   . Smokeless tobacco: Never Used  . Alcohol Use: No   OB History    Gravida Para Term Preterm AB TAB SAB Ectopic Multiple Living   2 2 2       2      Review of Systems  Constitutional: Negative for fever, chills, diaphoresis, appetite change and fatigue.  HENT: Negative for mouth sores, sore throat and trouble swallowing.   Eyes: Negative for visual disturbance.  Respiratory: Negative for cough, chest tightness, shortness of breath and wheezing.   Cardiovascular: Negative for chest pain.  Gastrointestinal: Positive for nausea. Negative for vomiting, abdominal pain, diarrhea and abdominal distention.  Endocrine: Negative for polydipsia, polyphagia and polyuria.  Genitourinary: Negative for dysuria, frequency and hematuria.  Musculoskeletal: Negative for gait problem.  Skin:  Negative for color change, pallor and rash.  Neurological: Positive for headaches. Negative for dizziness, syncope and light-headedness.  Hematological: Does not bruise/bleed easily.  Psychiatric/Behavioral: Negative for behavioral problems and confusion.      Allergies  Imitrex; Latex; and Penicillins  Home Medications   Prior to Admission medications   Medication Sig Start Date End Date Taking? Authorizing Provider  gabapentin (NEURONTIN) 100 MG capsule Take 1 capsule (100 mg total) by mouth 2 (two) times daily. 01/27/15   Rolland Porter, MD  Lysine 1000 MG TABS Take by mouth.    Historical Provider, MD  methocarbamol (ROBAXIN) 500 MG tablet Take 1 tablet (500 mg total) by mouth 2 (two) times daily. 01/27/15   Rolland Porter, MD  Multiple Vitamin  (MULTIVITAMIN) tablet Take 1 tablet by mouth daily.      Historical Provider, MD  naproxen (NAPROSYN) 500 MG tablet Take 1 tablet (500 mg total) by mouth 2 (two) times daily. 01/27/15   Rolland Porter, MD  Naproxen Sodium (ALEVE PO) Take by mouth as needed.    Historical Provider, MD  promethazine (PHENERGAN) 25 MG tablet Take 1 tablet (25 mg total) by mouth every 8 (eight) hours as needed for nausea or vomiting. 01/17/15   Gordy Savers, MD  topiramate (TOPAMAX) 50 MG tablet TAKE 1 TABLET BY MOUTH TWICE A DAY 12/14/14   Gordy Savers, MD  valACYclovir (VALTREX) 1000 MG tablet Take 1,000 mg by mouth daily.  11/15/13   Historical Provider, MD   BP 122/76 mmHg  Pulse 79  Temp(Src) 97.9 F (36.6 C) (Oral)  Resp 18  Ht 5' (1.524 m)  Wt 151 lb (68.493 kg)  BMI 29.49 kg/m2  SpO2 99%  LMP 06/11/2014 Physical Exam  Constitutional: She is oriented to person, place, and time. She appears well-developed and well-nourished. No distress.  HENT:  Head: Normocephalic.    Eyes: Conjunctivae are normal. Pupils are equal, round, and reactive to light. No scleral icterus.  Neck: Normal range of motion. Neck supple. No thyromegaly present.  Cardiovascular: Normal rate and regular rhythm.  Exam reveals no gallop and no friction rub.   No murmur heard. Pulmonary/Chest: Effort normal and breath sounds normal. No respiratory distress. She has no wheezes. She has no rales.  Abdominal: Soft. Bowel sounds are normal. She exhibits no distension. There is no tenderness. There is no rebound.  Musculoskeletal: Normal range of motion.  Neurological: She is alert and oriented to person, place, and time.  Negative Spurling sign. No Lhermitte symptoms.  Normal symmetric Strength to shoulder shrug, triceps, biceps, grip,wrist flex/extend,and intrinsics  Norma lsymmetric sensation above and below clavicles, and to all distributions to UEs.    Skin: Skin is warm and dry. No rash noted.  Psychiatric: She has a  normal mood and affect. Her behavior is normal.    ED Course  Procedures (including critical care time) Labs Review Labs Reviewed  PREGNANCY, URINE    Imaging Review Ct Head Wo Contrast  01/27/2015   CLINICAL DATA:  Headaches and nausea for 1 month.  EXAM: CT HEAD WITHOUT CONTRAST  TECHNIQUE: Contiguous axial images were obtained from the base of the skull through the vertex without intravenous contrast.  COMPARISON:  None.  FINDINGS: The brainstem, cerebellum, cerebral peduncles, thalamus, basal ganglia, basilar cisterns, and ventricular system appear within normal limits. No intracranial hemorrhage, mass lesion, or acute CVA.  IMPRESSION: 1.  No significant abnormality identified.   Electronically Signed   By: Annitta Needs.D.  On: 01/27/2015 10:03   I have personally reviewed and evaluated these images and lab results as part of my medical decision-making.   EKG Interpretation None      MDM   Final diagnoses:  Muscle tension headache  Occipital neuritis   Symptoms are most consistent with some muscle tension headache. Offer Robaxin and anti-inflammatory. She continues to have the burning sensation this may be a occipital neuralgia. She has a neurologist appointment but not for 8 weeks. Given her a prescription as a trial of Neurontin to try if she is not getting better after 10-14 days of anti-inflammatory, and muscle relaxants.    Rolland Porter, MD 01/27/15 1245

## 2015-01-27 NOTE — Discharge Instructions (Signed)
Take Robaxin, (a muscle relaxant), and naproxen for 7-10 days.  If your headache has not resolved, fill prescription for, and take Neurontin.    Tension Headache A tension headache is pain, pressure, or aching felt over the front and sides of the head. Tension headaches often come after stress, feeling worried (anxiety), or feeling sad or down for a while (depressed). HOME CARE  Only take medicine as told by your doctor.  Lie down in a dark, quiet room when you have a headache.  Keep a journal to find out if certain things bring on headaches. For example, write down:  What you eat and drink.  How much sleep you get.  Any change to your diet or medicines.  Relax by getting a massage or doing other relaxing activities.  Put ice or heat packs on the head and neck area as told by your doctor.  Lessen stress.  Sit up straight. Do not tighten (tense) your muscles.  Quit smoking if you smoke.  Lessen how much alcohol you drink.  Lessen how much caffeine you drink, or stop drinking caffeine.  Eat and exercise regularly.  Get enough sleep.  Avoid using too much pain medicine. GET HELP RIGHT AWAY IF:   Your headache becomes really bad.  You have a fever.  You have a stiff neck.  You have trouble seeing.  Your muscles are weak, or you lose muscle control.  You lose your balance or have trouble walking.  You feel like you will pass out (faint), or you pass out.  You have really bad symptoms that are different than your first symptoms.  You have problems with the medicines given to you by your doctor.  Your medicines do not work.  Your headache feels different than the other headaches.  You feel sick to your stomach (nauseous) or throw up (vomit). MAKE SURE YOU:   Understand these instructions.  Will watch your condition.  Will get help right away if you are not doing well or get worse. Document Released: 08/22/2009 Document Revised: 08/20/2011 Document  Reviewed: 05/18/2011 [facility] Patient Information 2015 [place], [place]. This information is not intended to replace advice given to you by your health care provider. Make sure you discuss any questions you have with your health care provider.  Occipital Neuralgia Occipital neuralgia is a type of headache that causes episodes of very bad pain in the back of your head. Pain from occipital neuralgia may spread (radiate) to other parts of your head. The pain is usually brief and often goes away after you rest and relax. These headaches may be caused by irritation of the nerves that leave your spinal cord high up in your neck, just below the base of your skull (occipital nerves). Your occipital nerves transmit sensations from the back of your head, the top of your head, and the areas behind your ears. CAUSES Occipital neuralgia can occur without any known cause (primary headache syndrome). In other cases, occipital neuralgia is caused by pressure on or irritation of one of the two occipital nerves. Causes of occipital nerve compression or irritation include:  Wear and tear of the vertebrae in the neck (osteoarthritis).  Neck injury.  Disease of the disks that separate the vertebrae.  Tumors.  Gout.  Infections.  Diabetes.  Swollen blood vessels that put pressure on the occipital nerves.  Muscle spasm in the neck. SIGNS AND SYMPTOMS Pain is the main symptom of occipital neuralgia. It usually starts in the back of the head but  may also be felt in other areas supplied by the occipital nerves. Pain is usually on one side but may be on both sides. You may have:   Brief episodes of very bad pain that is burning, stabbing, shocking, or shooting.  Pain behind the eye.  Pain triggered by neck movement or hair brushing.  Scalp tenderness.  Aching in the back of the head between episodes of very bad pain. DIAGNOSIS  Your health care provider may diagnose occipital neuralgia based on your  symptoms and a physical exam. During the exam, the health care provider may push on areas supplied by the occipital nerves to see if they are painful. Some tests may also be done to help in making the diagnosis. These may include:  Imaging studies of the upper spinal cord, such as an MRI or CT scan. These may show compression or spinal cord abnormalities.  Nerve block. You will get an injection of numbing medicine (local anesthetic) near the occipital nerve to see if this relieves pain. TREATMENT  Treatment may begin with simple measures, such as:   Rest.  Massage.  Heat.  Over-the-counter pain relievers. If these measures do not work, you may need other treatments, including:  Medicines such as:  Prescription-strength anti-inflammatory medicines.  Muscle relaxants.  Antiseizure medicines.  Antidepressants.  Steroid injection. This involves injections of local anesthetic and strong anti-inflammatory drugs (steroids).  Pulsed radiofrequency. Wires are implanted to deliver electrical impulses that block pain signals from the occipital nerve.  Physical therapy.  Surgery to relieve nerve pressure. HOME CARE INSTRUCTIONS  Take all medicines as directed by your health care provider.  Avoid activities that cause pain.  Rest when you have an attack of pain.  Try gentle massage or a heating pad to relieve pain.  Work with a physical therapist to learn stretching exercises you can do at home.  Try a different pillow or sleeping position.  Practice good posture.  Try to stay active. Get regular exercise that does not cause pain. Ask your health care provider to suggest safe exercises for you.  Keep all follow-up visits as directed by your health care provider. This is important. SEEK MEDICAL CARE IF:  Your medicine is not working.  You have new or worsening symptoms. SEEK IMMEDIATE MEDICAL CARE IF:  You have very bad head pain that is not going away.  You have a  sudden change in vision, balance, or speech. MAKE SURE YOU:  Understand these instructions.  Will watch your condition.  Will get help right away if you are not doing well or get worse. Document Released: 05/22/2001 Document Revised: 10/12/2013 Document Reviewed: 05/20/2013 [facility] Patient Information 2015 [place], [place]. This information is not intended to replace advice given to you by your health care provider. Make sure you discuss any questions you have with your health care provider.

## 2015-01-27 NOTE — ED Notes (Signed)
Patient transported to CT 

## 2015-01-27 NOTE — ED Notes (Signed)
Pt amb to room 8 with quick steady gait smiling in nad. Pt amb to restroom for urine specimen.

## 2015-02-10 ENCOUNTER — Ambulatory Visit: Payer: BLUE CROSS/BLUE SHIELD | Admitting: Internal Medicine

## 2015-02-10 ENCOUNTER — Ambulatory Visit (INDEPENDENT_AMBULATORY_CARE_PROVIDER_SITE_OTHER)
Admission: RE | Admit: 2015-02-10 | Discharge: 2015-02-10 | Disposition: A | Discharge status: Home / Self Care | Payer: BLUE CROSS/BLUE SHIELD | Source: Ambulatory Visit | Attending: Internal Medicine | Admitting: Internal Medicine

## 2015-02-10 ENCOUNTER — Encounter: Payer: Self-pay | Admitting: Internal Medicine

## 2015-02-10 ENCOUNTER — Ambulatory Visit (INDEPENDENT_AMBULATORY_CARE_PROVIDER_SITE_OTHER): Payer: BLUE CROSS/BLUE SHIELD | Admitting: Internal Medicine

## 2015-02-10 VITALS — BP 120/80 | HR 94 | Temp 98.3°F | Ht 60.0 in | Wt 156.0 lb

## 2015-02-10 DIAGNOSIS — R091 Pleurisy: Secondary | ICD-10-CM

## 2015-02-10 DIAGNOSIS — R109 Unspecified abdominal pain: Secondary | ICD-10-CM | POA: Diagnosis not present

## 2015-02-10 LAB — POCT URINALYSIS DIPSTICK
BILIRUBIN UA: NEGATIVE
GLUCOSE UA: NEGATIVE
Ketones, UA: NEGATIVE
Leukocytes, UA: NEGATIVE
NITRITE UA: NEGATIVE
Protein, UA: NEGATIVE
SPEC GRAV UA: 1.025
UROBILINOGEN UA: 1
pH, UA: 6.5

## 2015-02-10 MED ORDER — HYDROCODONE-ACETAMINOPHEN 5-325 MG PO TABS
1.0000 | ORAL_TABLET | Freq: Four times a day (QID) | ORAL | Status: DC | PRN
Start: 2015-02-10 — End: 2017-05-16

## 2015-02-10 NOTE — Patient Instructions (Signed)
Chest x-ray as discussed  Continue naproxen twice daily and analgesics as necessary  Report any clinical worsening or any new symptoms  Pleurisy Pleurisy is an inflammation and swelling of the lining of the lungs (pleura). Because of this inflammation, it hurts to breathe. It can be aggravated by coughing, laughing, or deep breathing. Pleurisy is often caused by an underlying infection or disease.  HOME CARE INSTRUCTIONS  Monitor your pleurisy for any changes. The following actions may help to alleviate any discomfort you are experiencing:  Medicine may help with pain. Only take over-the-counter or prescription medicines for pain, discomfort, or fever as directed by your health care provider.  Only take antibiotic medicine as directed. Make sure to finish it even if you start to feel better. SEEK MEDICAL CARE IF:   Your pain is not controlled with medicine or is increasing.  You have an increase in pus-like (purulent) secretions brought up with coughing. SEEK IMMEDIATE MEDICAL CARE IF:   You have blue or dark lips, fingernails, or toenails.  You are coughing up blood.  You have increased difficulty breathing.  You have continuing pain unrelieved by medicine or pain lasting more than 1 week.  You have pain that radiates into your neck, arms, or jaw.  You develop increased shortness of breath or wheezing.  You develop a fever, rash, vomiting, fainting, or other serious symptoms. MAKE SURE YOU:  Understand these instructions.   Will watch your condition.   Will get help right away if you are not doing well or get worse.  Document Released: 05/28/2005 Document Revised: 01/28/2013 Document Reviewed: 11/09/2012 [employer] Patient Information 2015 [place], [place]. This information is not intended to replace advice given to you by your health care provider. Make sure you discuss any questions you have with your health care provider.

## 2015-02-10 NOTE — Progress Notes (Signed)
Pre visit review using our clinic review tool, if applicable. No additional management support is needed unless otherwise documented below in the visit note. 

## 2015-02-10 NOTE — Addendum Note (Signed)
Addended by: Azucena Freed on: 02/10/2015 04:05 PM   Modules accepted: Orders

## 2015-02-10 NOTE — Progress Notes (Signed)
Subjective:    Patient ID: [name], female    DOB: 08/05/1960, 54 y.o.   MRN: [id number]  HPI  54 year old patient who presents with a chief complaint of right posterior lower chest pain.  This is described as sharp and aggravated by deep inspiration and movement.  Symptoms began early yesterday morning.  She has felt slightly congested with mild rhinorrhea and hoarseness but no frank productive cough.  There's been no fever or chills.  She is concerned about pneumonia. No shortness of breath  Past Medical History  Diagnosis Date  . GALLSTONES 03/23/2010  . NONSPECIFIC ABNORM FIND RAD&OTH EXAM BILI TRACT 02/02/2008  . OVARIAN CYST 03/23/2010  . Female infertility   . History of rubella     As a child   . H/O varicella     As a child   . Abnormal Pap smear 2001  . Hx: UTI (urinary tract infection) 04/2003  . ANEMIA 03/23/2010  . AMA (advanced maternal age) multigravida 35+   . Ulcer 2001  . H/O constipation 2001  . Mild mitral valve prolapse   . Headache(784.0) 12/18/2006    Frequently  . Migraine   . H/O mumps   . Dyspareunia 2008  . Heart murmur     Social History   Social History  . Marital Status: Married    Spouse Name: N/A  . Number of Children: 2  . Years of Education: N/A   Occupational History  .     Social History Main Topics  . Smoking status: Never Smoker   . Smokeless tobacco: Never Used  . Alcohol Use: No  . Drug Use: No  . Sexual Activity: Yes    Birth Control/ Protection: Pill   Other Topics Concern  . Not on file   Social History Narrative    Past Surgical History  Procedure Laterality Date  . Laparoscopic ovarian cystectomy    . Cesarean section      x 2  . Laparoscopic ablation renal mass    . Wisdom tooth extraction      Family History  Problem Relation Age of Onset  . Hypertension Mother   . Heart disease Father   . Hypertension Maternal Aunt   . Asthma Maternal Aunt   . Diabetes Maternal Aunt   . Colon cancer Neg Hx     . Esophageal cancer Neg Hx   . Stomach cancer Neg Hx   . Rectal cancer Neg Hx     Allergies  Allergen Reactions  . Imitrex [Sumatriptan] Anaphylaxis  . Latex Itching  . Penicillins Hives and Swelling    Current Outpatient Prescriptions on File Prior to Visit  Medication Sig Dispense Refill  . gabapentin (NEURONTIN) 100 MG capsule Take 1 capsule (100 mg total) by mouth 2 (two) times daily. 60 capsule 0  . Lysine 1000 MG TABS Take by mouth.    . methocarbamol (ROBAXIN) 500 MG tablet Take 1 tablet (500 mg total) by mouth 2 (two) times daily. 20 tablet 0  . Multiple Vitamin (MULTIVITAMIN) tablet Take 1 tablet by mouth daily.      . naproxen (NAPROSYN) 500 MG tablet Take 1 tablet (500 mg total) by mouth 2 (two) times daily. 30 tablet 0  . Naproxen Sodium (ALEVE PO) Take by mouth as needed.    . promethazine (PHENERGAN) 25 MG tablet Take 1 tablet (25 mg total) by mouth every 8 (eight) hours as needed for nausea or vomiting. 20 tablet 0  . topiramate (TOPAMAX)  50 MG tablet TAKE 1 TABLET BY MOUTH TWICE A DAY 60 tablet 2  . valACYclovir (VALTREX) 1000 MG tablet Take 1,000 mg by mouth daily.      No current facility-administered medications on file prior to visit.    BP 120/80 mmHg  Pulse 94  Temp(Src) 98.3 F (36.8 C) (Oral)  Ht 5' (1.524 m)  Wt 156 lb (70.761 kg)  BMI 30.47 kg/m2  SpO2 98%  LMP 06/11/2014     Review of Systems  Constitutional: Negative.   HENT: Positive for congestion and postnasal drip. Negative for dental problem, hearing loss, rhinorrhea, sinus pressure, sore throat and tinnitus.   Eyes: Negative for pain, discharge and visual disturbance.  Respiratory: Negative for cough and shortness of breath.   Cardiovascular: Positive for chest pain. Negative for palpitations and leg swelling.  Gastrointestinal: Negative for nausea, vomiting, abdominal pain, diarrhea, constipation, blood in stool and abdominal distention.  Genitourinary: Negative for dysuria, urgency,  frequency, hematuria, flank pain, vaginal bleeding, vaginal discharge, difficulty urinating, vaginal pain and pelvic pain.  Musculoskeletal: Negative for joint swelling, arthralgias and gait problem.  Skin: Negative for rash.  Neurological: Negative for dizziness, syncope, speech difficulty, weakness, numbness and headaches.  Hematological: Negative for adenopathy.  Psychiatric/Behavioral: Negative for behavioral problems, dysphoric mood and agitation. The patient is not nervous/anxious.        Objective:   Physical Exam  Constitutional: She is oriented to person, place, and time. She appears well-developed and well-nourished.  HENT:  Head: Normocephalic.  Right Ear: External ear normal.  Left Ear: External ear normal.  Mouth/Throat: Oropharynx is clear and moist.  Eyes: Conjunctivae and EOM are normal. Pupils are equal, round, and reactive to light.  Neck: Normal range of motion. Neck supple. No thyromegaly present.  Cardiovascular: Normal rate, regular rhythm, normal heart sounds and intact distal pulses.   Pulse 70  Pulmonary/Chest: Effort normal and breath sounds normal. No respiratory distress. She has no wheezes. She has no rales. She exhibits no tenderness.  O2 saturation 98-99%   Abdominal: Soft. Bowel sounds are normal. She exhibits no mass. There is no tenderness.  Musculoskeletal: Normal range of motion.  Lymphadenopathy:    She has no cervical adenopathy.  Neurological: She is alert and oriented to person, place, and time.  Skin: Skin is warm and dry. No rash noted.  Psychiatric: She has a normal mood and affect. Her behavior is normal.          Assessment & Plan:   Pleurisy.  Probable viral syndrome.  Will continue naproxen and give a prescription for Vicodin.  Patient very concerned about pneumonia.  Will check a chest x-ray

## 2015-03-11 ENCOUNTER — Other Ambulatory Visit: Payer: Self-pay | Admitting: Internal Medicine

## 2015-03-16 ENCOUNTER — Encounter: Payer: Self-pay | Admitting: Neurology

## 2015-03-16 ENCOUNTER — Ambulatory Visit (INDEPENDENT_AMBULATORY_CARE_PROVIDER_SITE_OTHER): Payer: Managed Care, Other (non HMO) | Admitting: Neurology

## 2015-03-16 VITALS — BP 110/80 | HR 72 | Resp 16 | Ht 61.0 in | Wt 153.0 lb

## 2015-03-16 DIAGNOSIS — M542 Cervicalgia: Secondary | ICD-10-CM

## 2015-03-16 DIAGNOSIS — G44219 Episodic tension-type headache, not intractable: Secondary | ICD-10-CM

## 2015-03-16 DIAGNOSIS — R42 Dizziness and giddiness: Secondary | ICD-10-CM

## 2015-03-16 MED ORDER — METHOCARBAMOL 500 MG PO TABS: 500.0000 mg | ORAL_TABLET | Freq: Two times a day (BID) | ORAL | Status: DC | PRN

## 2015-03-16 NOTE — Progress Notes (Signed)
NEUROLOGY CONSULTATION NOTE  [name] MRN: [id number] DOB: January 05, 1961  Referring provider: Dr. Amador Cunas Primary care provider: Dr. Amador Cunas  Reason for consult:  Headache  HISTORY OF PRESENT ILLNESS: [name] is a 54 year old right-handed female with mitral valve prolapse and history of childhood rubella who presents for headache.  History obtained by patient and ED note.  Images of head CT in August, as well as cervical MRI from 2013 reviewed.  Labs reviewed.  About 2 months ago, [name] began experiencing dizzy spells.  They would occur spontaneously.  It was not a spinning sensation, but a feeling of being "off-kilter".  It was associated with lethargy, diaphoresis and slurred speech.  There was no associated nausea, upset stomach, visual disturbance, or palpitations.  When it occurred in the store, she had trouble counting money at the register, but she was able to drive and otherwise perform daily activities.  There was associated headache, in a band-like distribution and radiating down the neck and into the shoulders.  Bending forward would exacerbate it.  It would last several hours, until she was able to lay down.  It occurred daily until she went to the ED on 01/27/15.  CT of the head was performed, which was unremarkable.  She was diagnosed with tension-type headache and she was prescribed naproxen  twice daily, methocarbamol  twice daily and gabapentin  twice daily, to be taken for 2 weeks.  She has not had a spell since then.  She does have longstanding history of headaches for several years.  Since the birth of her second son in 2009, she reports neck pain, which makes it uncomfortable to sleep on her back or on a pillow.  [facility] where she was diagnosed with migraines.  An MRI of the cervical spine performed in 2013 revealed some mild disc bulges but nothing significant to explain the neck pain or headache.  She has no  family history of cerebral aneurysm.  PAST MEDICAL HISTORY: Past Medical History  Diagnosis Date  . GALLSTONES 03/23/2010  . NONSPECIFIC ABNORM FIND RAD&OTH EXAM BILI TRACT 02/02/2008  . OVARIAN CYST 03/23/2010  . Female infertility   . History of rubella     As a child   . H/O varicella     As a child   . Abnormal Pap smear 2001  . Hx: UTI (urinary tract infection) 04/2003  . ANEMIA 03/23/2010  . AMA (advanced maternal age) multigravida 35+   . Ulcer 2001  . H/O constipation 2001  . Mild mitral valve prolapse   . Headache(784.0) 12/18/2006    Frequently  . Migraine   . H/O mumps   . Dyspareunia 2008  . Heart murmur     PAST SURGICAL HISTORY: Past Surgical History  Procedure Laterality Date  . Laparoscopic ovarian cystectomy    . Cesarean section      x 2  . Laparoscopic ablation renal mass    . Wisdom tooth extraction      MEDICATIONS: Current Outpatient Prescriptions on File Prior to Visit  Medication Sig Dispense Refill  . Lysine 1000 MG TABS Take by mouth.    . Multiple Vitamin (MULTIVITAMIN) tablet Take 1 tablet by mouth daily.      . Naproxen Sodium (ALEVE PO) Take by mouth as needed.    . topiramate (TOPAMAX) 50 MG tablet TAKE 1 TABLET BY MOUTH TWICE A DAY 60 tablet 2  . valACYclovir (VALTREX) 1000 MG tablet Take 1,000 mg  by mouth daily.     Marland Kitchen HYDROcodone-acetaminophen (NORCO/VICODIN) 5-325 MG per tablet Take 1 tablet by mouth every 6 (six) hours as needed for moderate pain. (Patient not taking: Reported on 03/16/2015) 30 tablet 0   No current facility-administered medications on file prior to visit.    ALLERGIES: Allergies  Allergen Reactions  . Imitrex [Sumatriptan] Anaphylaxis  . Latex Itching  . Penicillins Hives and Swelling    FAMILY HISTORY: Family History  Problem Relation Age of Onset  . Hypertension Mother   . Heart disease Father   . Hypertension Maternal Aunt   . Asthma Maternal Aunt   . Diabetes Maternal Aunt   . Colon cancer Neg Hx     . Esophageal cancer Neg Hx   . Stomach cancer Neg Hx   . Rectal cancer Neg Hx     SOCIAL HISTORY: Social History   Social History  . Marital Status: Married    Spouse Name: N/A  . Number of Children: 2  . Years of Education: N/A   Occupational History  .     Social History Main Topics  . Smoking status: Never Smoker   . Smokeless tobacco: Never Used  . Alcohol Use: No  . Drug Use: No  . Sexual Activity: Yes    Birth Control/ Protection: Pill   Other Topics Concern  . Not on file   Social History Narrative    REVIEW OF SYSTEMS: Constitutional: No fevers, chills, or sweats, no generalized fatigue, change in appetite Eyes: No visual changes, double vision, eye pain Ear, nose and throat: No hearing loss, ear pain, nasal congestion, sore throat Cardiovascular: No chest pain, palpitations Respiratory:  No shortness of breath at rest or with exertion, wheezes GastrointestinaI: No nausea, vomiting, diarrhea, abdominal pain, fecal incontinence Genitourinary:  No dysuria, urinary retention or frequency Musculoskeletal:  No neck pain, back pain Integumentary: No rash, pruritus, skin lesions Neurological: as above Psychiatric: No depression, insomnia, anxiety Endocrine: No palpitations, fatigue, diaphoresis, mood swings, change in appetite, change in weight, increased thirst Hematologic/Lymphatic:  No anemia, purpura, petechiae. Allergic/Immunologic: no itchy/runny eyes, nasal congestion, recent allergic reactions, rashes  PHYSICAL EXAM: Filed Vitals:   03/16/15 0846  BP: 110/80  Pulse: 72  Resp: 16   General: No acute distress.  Patient appears well-groomed. Head:  Normocephalic/atraumatic Eyes:  fundi unremarkable, without vessel changes, exudates, hemorrhages or papilledema. Neck: supple, no paraspinal tenderness, full range of motion Back: No paraspinal tenderness Heart: regular rate and rhythm Lungs: Clear to auscultation bilaterally. Vascular: No carotid  bruits. Neurological Exam: Mental status: alert and oriented to person, place, and time, recent and remote memory intact, fund of knowledge intact, attention and concentration intact, speech fluent and not dysarthric, language intact. Cranial nerves: CN I: not tested CN II: pupils equal, round and reactive to light, visual fields intact, fundi unremarkable, without vessel changes, exudates, hemorrhages or papilledema. CN III, IV, VI:  full range of motion, no nystagmus, no ptosis CN V: facial sensation intact CN VII: upper and lower face symmetric CN VIII: hearing intact CN IX, X: gag intact, uvula midline CN XI: sternocleidomastoid and trapezius muscles intact CN XII: tongue midline Bulk & Tone: normal, no fasciculations. Motor:  5/5 throughout Sensation: temperature and vibration sensation intact. Deep Tendon Reflexes:  2+ throughout, toes downgoing.  Finger to nose testing:  Without dysmetria.  Heel to shin:  Without dysmetria.  Gait:  Normal station and stride.  Able to turn and tandem walk. Romberg negative.  IMPRESSION: Tension-type  headache.  Headaches sound like tension-type, however that would not explain these dizzy spells.  The dizzy spells, in conjunction with chronic neck pain, may have triggered the headache, however. Episodic dizzy spells, resolved.  I have no explanation for this.  With history of migraines, an atypical migraine is possible.  Workup, including evaluation of diabetes, was unremarkable.  They have since resolved. Chronic neck pain  PLAN: For neck pain, I will refill her methocarbamol, to be taken if needed for neck pain.  I will refer her to Dr. Antoine Primas for evaluation and treatment of chronic neck pain.   She will monitor for any recurrent spells.  She may follow up if they recur.  Thank you for allowing me to take part in the care of this patient.  Shon Millet, DO  CC:  [name], MD

## 2015-03-16 NOTE — Patient Instructions (Signed)
I think the headaches were tension type headaches, however that wouldn't explain the dizzy spells.  The dizzy spells may have triggered the headache.   For the neck pain, which also may trigger headache, I will prescribe you methocarbamol .  Limit use and only take as needed.  I will also refer you to Dr. Antoine Primas of Sports Medicine, who I think will help you with the neck discomfort.  Follow up as needed.

## 2015-03-30 ENCOUNTER — Ambulatory Visit: Payer: 59 | Admitting: Family Medicine

## 2015-03-31 ENCOUNTER — Other Ambulatory Visit: Payer: Self-pay | Admitting: Neurology

## 2015-03-31 NOTE — Telephone Encounter (Signed)
#  30 pills given on 03-16-15.  Ok to refill?

## 2015-06-12 ENCOUNTER — Other Ambulatory Visit: Payer: Self-pay | Admitting: Internal Medicine

## 2015-06-26 ENCOUNTER — Emergency Department (HOSPITAL_BASED_OUTPATIENT_CLINIC_OR_DEPARTMENT_OTHER)
Admission: EM | Admit: 2015-06-26 | Discharge: 2015-06-26 | Disposition: A | Discharge status: Home / Self Care | Payer: Managed Care, Other (non HMO) | Attending: Emergency Medicine | Admitting: Emergency Medicine

## 2015-06-26 ENCOUNTER — Encounter (HOSPITAL_BASED_OUTPATIENT_CLINIC_OR_DEPARTMENT_OTHER): Payer: Self-pay | Admitting: Emergency Medicine

## 2015-06-26 DIAGNOSIS — Z8619 Personal history of other infectious and parasitic diseases: Secondary | ICD-10-CM | POA: Diagnosis not present

## 2015-06-26 DIAGNOSIS — R51 Headache: Secondary | ICD-10-CM | POA: Insufficient documentation

## 2015-06-26 DIAGNOSIS — Z79899 Other long term (current) drug therapy: Secondary | ICD-10-CM | POA: Insufficient documentation

## 2015-06-26 DIAGNOSIS — Z91040 Latex allergy status: Secondary | ICD-10-CM | POA: Diagnosis not present

## 2015-06-26 DIAGNOSIS — Z8679 Personal history of other diseases of the circulatory system: Secondary | ICD-10-CM | POA: Diagnosis not present

## 2015-06-26 DIAGNOSIS — H53149 Visual discomfort, unspecified: Secondary | ICD-10-CM | POA: Diagnosis not present

## 2015-06-26 DIAGNOSIS — R011 Cardiac murmur, unspecified: Secondary | ICD-10-CM | POA: Insufficient documentation

## 2015-06-26 DIAGNOSIS — Z880 Allergy status to penicillin: Secondary | ICD-10-CM | POA: Insufficient documentation

## 2015-06-26 DIAGNOSIS — Z862 Personal history of diseases of the blood and blood-forming organs and certain disorders involving the immune mechanism: Secondary | ICD-10-CM | POA: Diagnosis not present

## 2015-06-26 DIAGNOSIS — Z8742 Personal history of other diseases of the female genital tract: Secondary | ICD-10-CM | POA: Diagnosis not present

## 2015-06-26 DIAGNOSIS — M542 Cervicalgia: Secondary | ICD-10-CM | POA: Insufficient documentation

## 2015-06-26 DIAGNOSIS — Z87440 Personal history of urinary (tract) infections: Secondary | ICD-10-CM | POA: Diagnosis not present

## 2015-06-26 DIAGNOSIS — R519 Headache, unspecified: Secondary | ICD-10-CM

## 2015-06-26 DIAGNOSIS — R110 Nausea: Secondary | ICD-10-CM | POA: Insufficient documentation

## 2015-06-26 DIAGNOSIS — G8929 Other chronic pain: Secondary | ICD-10-CM | POA: Insufficient documentation

## 2015-06-26 DIAGNOSIS — Z872 Personal history of diseases of the skin and subcutaneous tissue: Secondary | ICD-10-CM | POA: Diagnosis not present

## 2015-06-26 MED ORDER — METOCLOPRAMIDE HCL 5 MG/ML IJ SOLN
10.0000 mg | Freq: Once | INTRAMUSCULAR | Status: AC
  Administered 2015-06-26: 10 mg via INTRAVENOUS
  Filled 2015-06-26: qty 2

## 2015-06-26 MED ORDER — KETOROLAC TROMETHAMINE 30 MG/ML IJ SOLN
30.0000 mg | Freq: Once | INTRAMUSCULAR | Status: AC
  Administered 2015-06-26: 30 mg via INTRAVENOUS
  Filled 2015-06-26: qty 1

## 2015-06-26 MED ORDER — DEXAMETHASONE SODIUM PHOSPHATE 10 MG/ML IJ SOLN
10.0000 mg | Freq: Once | INTRAMUSCULAR | Status: AC
  Administered 2015-06-26: 10 mg via INTRAVENOUS
  Filled 2015-06-26: qty 1

## 2015-06-26 NOTE — Discharge Instructions (Signed)
General Headache Without Cause Follow-up with her primary care physician or your neurologist. A headache is pain or discomfort felt around the head or neck area. The specific cause of a headache may not be found. There are many causes and types of headaches. A few common ones are:  Tension headaches.  Migraine headaches.  Cluster headaches.  Chronic daily headaches. HOME CARE INSTRUCTIONS  Watch your condition for any changes. Take these steps to help with your condition: Managing Pain  Take over-the-counter and prescription medicines only as told by your health care provider.  Lie down in a dark, quiet room when you have a headache.  If directed, apply ice to the head and neck area:  Put ice in a plastic bag.  Place a towel between your skin and the bag.  Leave the ice on for 20 minutes, 2-3 times per day.  Use a heating pad or hot shower to apply heat to the head and neck area as told by your health care provider.  Keep lights dim if bright lights bother you or make your headaches worse. Eating and Drinking  Eat meals on a regular schedule.  Limit alcohol use.  Decrease the amount of caffeine you drink, or stop drinking caffeine. General Instructions  Keep all follow-up visits as told by your health care provider. This is important.  Keep a headache journal to help find out what may trigger your headaches. For example, write down:  What you eat and drink.  How much sleep you get.  Any change to your diet or medicines.  Try massage or other relaxation techniques.  Limit stress.  Sit up straight, and do not tense your muscles.  Do not use tobacco products, including cigarettes, chewing tobacco, or e-cigarettes. If you need help quitting, ask your health care provider.  Exercise regularly as told by your health care provider.  Sleep on a regular schedule. Get 7-9 hours of sleep, or the amount recommended by your health care provider. SEEK MEDICAL CARE IF:     Your symptoms are not helped by medicine.  You have a headache that is different from the usual headache.  You have nausea or you vomit.  You have a fever. SEEK IMMEDIATE MEDICAL CARE IF:   Your headache becomes severe.  You have repeated vomiting.  You have a stiff neck.  You have a loss of vision.  You have problems with speech.  You have pain in the eye or ear.  You have muscular weakness or loss of muscle control.  You lose your balance or have trouble walking.  You feel faint or pass out.  You have confusion.   This information is not intended to replace advice given to you by your health care provider. Make sure you discuss any questions you have with your health care provider.   Document Released: 05/28/2005 Document Revised: 02/16/2015 Document Reviewed: 09/20/2014 [employer] Interactive Patient Education [employer]2016 [employer]

## 2015-06-26 NOTE — ED Notes (Addendum)
Patient states has she had an atypical Migraine HA  - the patient reports the she is having unbearable pain, the patient has attempted to take some OTC medications and has not helped. Patient states that she is having pain to her frontal sinus area and her neck and shoulders hurt as well. These are not her typical MHA symptoms. Patient reports that she is nausea

## 2015-06-26 NOTE — ED Provider Notes (Signed)
CSN: [id number]     Arrival date & time 06/26/15  1937 History   First MD Initiated Contact with Patient 06/26/15 2037     Chief Complaint  Patient presents with  . Headache   (Consider location/radiation/quality/duration/timing/severity/associated sxs/prior Treatment) Patient is a 55 y.o. female presenting with headaches. The history is provided by the patient and the spouse. No language interpreter was used.  Headache Associated symptoms: nausea, neck pain and photophobia   Associated symptoms: no back pain, no dizziness, no eye pain, no fever, no numbness, no vomiting and no weakness    Miss Kruckenberg is a 55 year old female with a past medical history of childhood rubella, mitral valve prolapse, tension headaches diagnosed by neurology who presents with a constant generalized headache 48 hours. She tried taking Robaxin, Topamax, ibuprofen, and Aleve without relief. She also took essential oils. Pain occasionally radiates down her neck. She also reports nausea. States headache is 10/10 now. She states these are similar to her previous headaches but unrelieved by at home medications.  She denies any vision changes, fever, chills, night sweats, eye pain, focal temporal pain, or vomiting.   Past Medical History  Diagnosis Date  . GALLSTONES 03/23/2010  . NONSPECIFIC ABNORM FIND RAD&OTH EXAM BILI TRACT 02/02/2008  . OVARIAN CYST 03/23/2010  . Female infertility   . History of rubella     As a child   . H/O varicella     As a child   . Abnormal Pap smear 2001  . Hx: UTI (urinary tract infection) 04/2003  . ANEMIA 03/23/2010  . AMA (advanced maternal age) multigravida 35+   . Ulcer 2001  . H/O constipation 2001  . Mild mitral valve prolapse   . Headache(784.0) 12/18/2006    Frequently  . Migraine   . H/O mumps   . Dyspareunia 2008  . Heart murmur    Past Surgical History  Procedure Laterality Date  . Laparoscopic ovarian cystectomy    . Cesarean section      x 2  . Laparoscopic  ablation renal mass    . Wisdom tooth extraction     Family History  Problem Relation Age of Onset  . Hypertension Mother   . Heart disease Father   . Hypertension Maternal Aunt   . Asthma Maternal Aunt   . Diabetes Maternal Aunt   . Colon cancer Neg Hx   . Esophageal cancer Neg Hx   . Stomach cancer Neg Hx   . Rectal cancer Neg Hx    Social History  Substance Use Topics  . Smoking status: Never Smoker   . Smokeless tobacco: Never Used  . Alcohol Use: No   OB History    Gravida Para Term Preterm AB TAB SAB Ectopic Multiple Living   2 2 2       2      Review of Systems  Constitutional: Negative for fever and chills.  Eyes: Positive for photophobia. Negative for pain and visual disturbance.  Gastrointestinal: Positive for nausea. Negative for vomiting.  Musculoskeletal: Positive for neck pain. Negative for back pain.  Neurological: Positive for headaches. Negative for dizziness, syncope, speech difficulty, weakness, light-headedness and numbness.  All other systems reviewed and are negative.     Allergies  Imitrex; Latex; and Penicillins  Home Medications   Prior to Admission medications   Medication Sig Start Date End Date Taking? Authorizing Provider  HYDROcodone-acetaminophen (NORCO/VICODIN) 5-325 MG per tablet Take 1 tablet by mouth every 6 (six) hours as needed for moderate  pain. Patient not taking: Reported on 03/16/2015 02/10/15   Gordy SaversPeter F Kwiatkowski, MD  Lysine 1000 MG TABS Take by mouth.    Historical Provider, MD  methocarbamol (ROBAXIN) 500 MG tablet TAKE 1 TABLET BY MOUTH TWICE A DAY AS NEEDED FOR MUSCLE SPASMS 03/31/15   [place] Chardonika K Patel, DO  Multiple Vitamin (MULTIVITAMIN) tablet Take 1 tablet by mouth daily.      Historical Provider, MD  Naproxen Sodium (ALEVE PO) Take by mouth as needed.    Historical Provider, MD  topiramate (TOPAMAX) 50 MG tablet TAKE 1 TABLET BY MOUTH TWICE A DAY 06/14/15   Gordy SaversPeter F Kwiatkowski, MD  valACYclovir (VALTREX) 1000 MG tablet Take  1,000 mg by mouth daily.  11/15/13   Historical Provider, MD   BP 120/64 mmHg  Pulse 63  Temp(Src) 98.1 F (36.7 C) (Oral)  Resp 20  Ht 5' (1.524 m)  Wt 69.4 kg  BMI 29.88 kg/m2  SpO2 100% Physical Exam  Constitutional: She is oriented to person, place, and time. She appears well-developed and well-nourished. No distress.  HENT:  Head: Normocephalic and atraumatic.  Eyes: Conjunctivae are normal.  Photophobia. Pupils equal round reactive to light bilaterally.  Neck: Normal range of motion. Neck supple. No spinous process tenderness and no muscular tenderness present. Normal range of motion present. No Brudzinski's sign and no Kernig's sign noted.  No midline cervical tenderness. Able to touch chin to chest. No meningismus signs.  Cardiovascular: Normal rate.   Pulmonary/Chest: Effort normal. No respiratory distress.  Musculoskeletal: Normal range of motion.  Neurological: She is alert and oriented to person, place, and time. She has normal strength. No sensory deficit. She displays a negative Romberg sign. Gait normal. GCS eye subscore is 4. GCS verbal subscore is 5. GCS motor subscore is 6.  Radial nerves III through XII intact. No sensory deficit. Normal motor. Negative Romberg. Normal finger to nose bilaterally.  Skin: Skin is warm and dry.  Nursing note and vitals reviewed.   ED Course  Procedures (including critical care time) Labs Review Labs Reviewed - No data to display  Imaging Review No results found.   EKG Interpretation None      MDM   Final diagnoses:  Acute nonintractable headache, unspecified headache type  Patient presents for headache x 2 days. Patient has a history of tension headaches and is followed by lumbar neurology. She had a CT scan on 01/27/2015 which was negative for any intracranial abnormality. She was seen by neurology on 03/16/2015 and was given Robaxin for chronic neck pain. She has had a history of headaches since childhood. She states this  is similar to her previous headaches but that she cannot control it with her usual home meds. Medications  metoCLOPramide (REGLAN) injection 10 mg (10 mg Intravenous Given 06/26/15 2051)  ketorolac (TORADOL) 30 MG/ML injection 30 mg (30 mg Intravenous Given 06/26/15 2051)  dexamethasone (DECADRON) injection 10 mg (10 mg Intravenous Given 06/26/15 2051)  Recheck: Patient states she is feeling much better after headache cocktail. I do not suspect SAH, meningitis, glaucoma, or temporal arteritis. Return precautions were discussed as well as follow up with PCP or neurology. Patient agrees with plan. Filed Vitals:   06/26/15 1951 06/26/15 2056  BP: 125/56 120/64  Pulse: 68 63  Temp: 98.1 F (36.7 C)   Resp: 18 [address]20        Chaslyn Eisen Patel-Mills, PA-C 06/26/15 2357  Glynn OctaveStephen Rancour, MD 06/27/15 [phone]

## 2015-10-10 ENCOUNTER — Ambulatory Visit (INDEPENDENT_AMBULATORY_CARE_PROVIDER_SITE_OTHER): Payer: Managed Care, Other (non HMO) | Admitting: Family Medicine

## 2015-10-10 ENCOUNTER — Encounter: Payer: Self-pay | Admitting: Family Medicine

## 2015-10-10 VITALS — BP 120/80 | HR 76 | Temp 98.2°F | Wt 155.0 lb

## 2015-10-10 DIAGNOSIS — M5441 Lumbago with sciatica, right side: Secondary | ICD-10-CM | POA: Diagnosis not present

## 2015-10-10 DIAGNOSIS — R350 Frequency of micturition: Secondary | ICD-10-CM

## 2015-10-10 LAB — POCT URINALYSIS DIPSTICK
Bilirubin, UA: NEGATIVE
Glucose, UA: NEGATIVE
Ketones, UA: NEGATIVE
Leukocytes, UA: NEGATIVE
NITRITE UA: NEGATIVE
PH UA: 7
PROTEIN UA: NEGATIVE
SPEC GRAV UA: 1.015
UROBILINOGEN UA: 0.2

## 2015-10-10 MED ORDER — CYCLOBENZAPRINE HCL 5 MG PO TABS: 5.0000 mg | ORAL_TABLET | Freq: Three times a day (TID) | ORAL | Status: DC | PRN

## 2015-10-10 MED ORDER — IBUPROFEN 600 MG PO TABS: 600.0000 mg | ORAL_TABLET | Freq: Four times a day (QID) | ORAL | Status: DC | PRN

## 2015-10-10 NOTE — Progress Notes (Signed)
Subjective:    Patient ID: Cornella Emmer, female    DOB: 07/15/1960, 55 y.o.   MRN: [id number]  HPI  Ms. Munoz is a 55 year old female who presents today with back pain.   BACK PAIN Date,time of day of onset of back pain: Two days ago back pain started after attending a women's retreat, traveling in a car and sleeping in a hotel Triggered by traveling in a car 6 hours and also trying to sleep in car. Location of back pain: Lower right sided back with radiation to right leg. Character of pain: Rated as a 10, noted as a penetrating ache that extends down right let Duration of pain: Constant Radiation to Right leg and history of sciatica noted by patient Pain is aggravated with standing but improves with sitting. Treatment of back pain: Vicodin provided moderate benefit and ice. She had vicodin from a previous Rx for a headache. Response to these treatments: Moderate benefit  Past medical history of "bulging disc and sciatica". 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 She was referred to neuro surgery by her PCP at that time and she did not wish to proceed with surgery after this visit and discussion with surgeon as symptoms have not been present until today per patient.  She denies fever, chills, sweats, unexplained weight loss,excessive fatigue, weakness, numbness, tingling in legs, difficulty with balance or walking, or loss of control of bowels or bladder.   Urinary frequency noted for one day. Associated symptom of constipation that is a chronic issue for her. She denies buring, hematuria, urgency, flank pain, fever, chills, or sweats. No treatment currently for constipation but it has worsened with Vicodin use.   Review of Systems  Constitutional: Negative for fever, chills and fatigue.  Eyes: Negative for visual disturbance.  Respiratory:  Negative for cough and shortness of breath.   Cardiovascular: Negative for chest pain, palpitations and leg swelling.  Gastrointestinal: Positive for constipation. Negative for nausea, vomiting and diarrhea.  Genitourinary: Positive for frequency. Negative for urgency, hematuria, flank pain and difficulty urinating.  Musculoskeletal: Positive for back pain.  Skin: Negative for rash.  Neurological: Negative for dizziness, light-headedness, numbness and headaches.  Hematological: Does not bruise/bleed easily.  Psychiatric/Behavioral:       Denies depressed or anxious mood   Past Medical History  Diagnosis Date  . GALLSTONES 03/23/2010  . NONSPECIFIC ABNORM FIND RAD&OTH EXAM BILI TRACT 02/02/2008  . OVARIAN CYST 03/23/2010  . Female infertility   . History of rubella     As a child   . H/O varicella     As a child   . Abnormal Pap smear 2001  . Hx: UTI (urinary tract infection) 04/2003  . ANEMIA 03/23/2010  . AMA (advanced maternal age) multigravida 35+   . Ulcer 2001  . H/O constipation 2001  . Mild mitral valve prolapse   . Headache(784.0) 12/18/2006    Frequently  . Migraine   . H/O mumps   . Dyspareunia 2008  . Heart murmur      Social History   Social History  . Marital Status: Married    Spouse Name: N/A  . Number of Children: 2  . Years of Education: N/A   Occupational History  .     Social History Main Topics  . Smoking status: Never Smoker   . Smokeless tobacco: Never Used  .  Alcohol Use: No  . Drug Use: No  . Sexual Activity: Yes    Birth Control/ Protection: Pill   Other Topics Concern  . Not on file   Social History Narrative    Past Surgical History  Procedure Laterality Date  . Laparoscopic ovarian cystectomy    . Cesarean section      x 2  . Laparoscopic ablation renal mass    . Wisdom tooth extraction      Family History  Problem Relation Age of Onset  . Hypertension Mother   . Heart disease Father   . Hypertension Maternal Aunt   .  Asthma Maternal Aunt   . Diabetes Maternal Aunt   . Colon cancer Neg Hx   . Esophageal cancer Neg Hx   . Stomach cancer Neg Hx   . Rectal cancer Neg Hx     Allergies  Allergen Reactions  . Imitrex [Sumatriptan] Anaphylaxis  . Latex Itching  . Penicillins Hives and Swelling    Current Outpatient Prescriptions on File Prior to Visit  Medication Sig Dispense Refill  . HYDROcodone-acetaminophen (NORCO/VICODIN) 5-325 MG per tablet Take 1 tablet by mouth every 6 (six) hours as needed for moderate pain. 30 tablet 0  . Lysine 1000 MG TABS Take by mouth.    . methocarbamol (ROBAXIN) 500 MG tablet TAKE 1 TABLET BY MOUTH TWICE A DAY AS NEEDED FOR MUSCLE SPASMS 30 tablet 0  . Multiple Vitamin (MULTIVITAMIN) tablet Take 1 tablet by mouth daily.      Marland Kitchen. topiramate (TOPAMAX) 50 MG tablet TAKE 1 TABLET BY MOUTH TWICE A DAY 60 tablet 3  . valACYclovir (VALTREX) 1000 MG tablet Take 1,000 mg by mouth daily.      No current facility-administered medications on file prior to visit.    BP 120/80 mmHg  Pulse 76  Temp(Src) 98.2 F (36.8 C) (Oral)  Wt 155 lb (70.308 kg)       Objective:   Physical Exam  Constitutional: She is oriented to person, place, and time. She appears well-developed and well-nourished.  Neck: Neck supple.  Cardiovascular: Normal rate, regular rhythm, normal heart sounds and intact distal pulses.  Exam reveals no gallop and no friction rub.   No murmur heard. Pulmonary/Chest: Effort normal and breath sounds normal. She has no wheezes. She has no rales.  Abdominal: Soft. Bowel sounds are normal. She exhibits no distension. There is no tenderness.  Musculoskeletal:  Spine with normal alignment and no deformity. No tenderness to vertebral process with palpation with the exception of mild tenderness around L4-L5.  Paraspinous muscles are tender and patient notes this area as painful and aching when walking. ROM is full at lumbar sacral regions. Discomfort with Straight Leg raise.  No CVA tenderness present. Able to heel/toe walk without pain.    Neurological: She is alert and oriented to person, place, and time. She has normal strength. No sensory deficit.  Reflex Scores:      Patellar reflexes are 2+ on the right side and 2+ on the left side.      Achilles reflexes are 2+ on the right side and 2+ on the left side. Skin: Skin is warm and dry. No rash noted.  Psychiatric: She has a normal mood and affect. Her behavior is normal. Thought content normal.    Assessment & Plan:  1. Right-sided low back pain with right-sided sciatica Discussed with patient causes for back pain and symptoms to monitor for that require immediate medical attention. Also reviewed prior  decision for neuro surgery by her PCP with her. Discussed follow up with PCP if symptoms do not improve with treatment for possible imaging and referral to neuro surgery. Patient voiced understanding and agreed with plan. - ibuprofen (ADVIL,MOTRIN) 600 MG tablet; Take 1 tablet (600 mg total) by mouth every 6 (six) hours as needed.  Dispense: 30 tablet; Refill: 0 - cyclobenzaprine (FLEXERIL) 5 MG tablet; Take 1 tablet (5 mg total) by mouth 3 (three) times daily as needed for muscle spasms.  Dispense: 30 tablet; Refill: 1  2. Urinary frequency POCT urine results are unremarkable. Advised patient to increase water and discussed symptoms of urinary tract infection to consider such as burning, urgency, blood in urine, flank pain, and fever, chills, and sweats. Discussed using Miralax for constipation. - POC Urinalysis Dipstick  Roddie Mc, FNP-C

## 2015-10-10 NOTE — Patient Instructions (Signed)
Please take medications as prescribed and follow up in 3-4 days if symptoms persists or worsen.   Back Pain, Adult Back pain is very common in adults.The cause of back pain is rarely dangerous and the pain often gets better over time.The cause of your back pain may not be known. Some common causes of back pain include:  Strain of the muscles or ligaments supporting the spine.  Wear and tear (degeneration) of the spinal disks.  Arthritis.  Direct injury to the back. For many people, back pain may return. Since back pain is rarely dangerous, most people can learn to manage this condition on their own. HOME CARE INSTRUCTIONS Watch your back pain for any changes. The following actions may help to lessen any discomfort you are feeling:  Remain active. It is stressful on your back to sit or stand in one place for long periods of time. Do not sit, drive, or stand in one place for more than 30 minutes at a time. Take short walks on even surfaces as soon as you are able.Try to increase the length of time you walk each day.  Exercise regularly as directed by your health care provider. Exercise helps your back heal faster. It also helps avoid future injury by keeping your muscles strong and flexible.  Do not stay in bed.Resting more than 1-2 days can delay your recovery.  Pay attention to your body when you bend and lift. The most comfortable positions are those that put less stress on your recovering back. Always use proper lifting techniques, including:  Bending your knees.  Keeping the load close to your body.  Avoiding twisting.  Find a comfortable position to sleep. Use a firm mattress and lie on your side with your knees slightly bent. If you lie on your back, put a pillow under your knees.  Avoid feeling anxious or stressed.Stress increases muscle tension and can worsen back pain.It is important to recognize when you are anxious or stressed and learn ways to manage it, such as with  exercise.  Take medicines only as directed by your health care provider. Over-the-counter medicines to reduce pain and inflammation are often the most helpful.Your health care provider may prescribe muscle relaxant drugs.These medicines help dull your pain so you can more quickly return to your normal activities and healthy exercise.  Apply ice to the injured area:  Put ice in a plastic bag.  Place a towel between your skin and the bag.  Leave the ice on for 20 minutes, 2-3 times a day for the first 2-3 days. After that, ice and heat may be alternated to reduce pain and spasms.  Maintain a healthy weight. Excess weight puts extra stress on your back and makes it difficult to maintain good posture. SEEK MEDICAL CARE IF:  You have pain that is not relieved with rest or medicine.  You have increasing pain going down into the legs or buttocks.  You have pain that does not improve in one week.  You have night pain.  You lose weight.  You have a fever or chills. SEEK IMMEDIATE MEDICAL CARE IF:   You develop new bowel or bladder control problems.  You have unusual weakness or numbness in your arms or legs.  You develop nausea or vomiting.  You develop abdominal pain.  You feel faint.   This information is not intended to replace advice given to you by your health care provider. Make sure you discuss any questions you have with your health care  provider.   Document Released: 05/28/2005 Document Revised: 06/18/2014 Document Reviewed: 09/29/2013 Elsevier Interactive Patient Education [employer].

## 2015-10-13 ENCOUNTER — Telehealth: Payer: Self-pay | Admitting: Internal Medicine

## 2015-10-13 DIAGNOSIS — M5441 Lumbago with sciatica, right side: Secondary | ICD-10-CM

## 2015-10-13 NOTE — Telephone Encounter (Signed)
Okay for orthopedic referral 

## 2015-10-13 NOTE — Telephone Encounter (Signed)
Pt would like to have a referral to a specialist for her back.  Pt state that the Flexeril did not help with the pain it just made her super sleepy.

## 2015-10-13 NOTE — Telephone Encounter (Signed)
Please see message and advise 

## 2015-10-13 NOTE — Telephone Encounter (Signed)
Left message on personal voicemail that order for referral to Orthopedics was done and someone will be contacting you to schedule an appt. Any questions please call office.

## 2015-10-14 ENCOUNTER — Other Ambulatory Visit: Payer: Self-pay | Admitting: Internal Medicine

## 2015-10-27 ENCOUNTER — Other Ambulatory Visit: Payer: Self-pay

## 2015-10-27 DIAGNOSIS — Z1231 Encounter for screening mammogram for malignant neoplasm of breast: Secondary | ICD-10-CM

## 2015-11-07 ENCOUNTER — Other Ambulatory Visit: Payer: Self-pay | Admitting: Family Medicine

## 2015-11-17 NOTE — Telephone Encounter (Signed)
Rx refill sent to pharmacy. 

## 2015-11-21 ENCOUNTER — Ambulatory Visit: Payer: Managed Care, Other (non HMO)

## 2015-11-28 ENCOUNTER — Ambulatory Visit: Payer: Managed Care, Other (non HMO)

## 2015-12-01 ENCOUNTER — Ambulatory Visit: Payer: Managed Care, Other (non HMO)

## 2015-12-20 ENCOUNTER — Ambulatory Visit
Admission: RE | Admit: 2015-12-20 | Discharge: 2015-12-20 | Disposition: A | Discharge status: Home / Self Care | Payer: Managed Care, Other (non HMO) | Source: Ambulatory Visit

## 2015-12-20 DIAGNOSIS — Z1231 Encounter for screening mammogram for malignant neoplasm of breast: Secondary | ICD-10-CM

## 2016-01-16 ENCOUNTER — Ambulatory Visit (INDEPENDENT_AMBULATORY_CARE_PROVIDER_SITE_OTHER): Payer: Managed Care, Other (non HMO) | Admitting: Family Medicine

## 2016-01-16 ENCOUNTER — Encounter: Payer: Self-pay | Admitting: Family Medicine

## 2016-01-16 VITALS — BP 130/78 | HR 72 | Temp 97.8°F | Resp 16 | Ht 60.0 in | Wt 155.1 lb

## 2016-01-16 DIAGNOSIS — H00014 Hordeolum externum left upper eyelid: Secondary | ICD-10-CM

## 2016-01-16 DIAGNOSIS — H109 Unspecified conjunctivitis: Secondary | ICD-10-CM | POA: Diagnosis not present

## 2016-01-16 MED ORDER — OLOPATADINE HCL 0.7 % OP SOLN: 1.0000 [drp] | Freq: Every day | OPHTHALMIC | 2 refills | Status: DC

## 2016-01-16 NOTE — Progress Notes (Signed)
HPI:  ACUTE VISIT:  Chief Complaint  Patient presents with  . Eye Pain    Started yesterday, pt has used irritated eye drops. Left eye was red & teary. This morning it was swollen.    [name] is a 55 y.o. female, who is here today complaining of eye symptoms since yesterday.  + Epiphora and itching eyes bilateral. Today noted some mild pain on left upper eye lid and mild edema.  No Hx of trauma. Denies headache, visual changes, recent URI, nausea, or vomiting. No foreign body sensation or photophobia. She tried OTC eye drops, she does not recall name but it helped.  She denies any sick contact. No recent travel.  Review of Systems  Constitutional: Negative for activity change, appetite change, chills, fatigue and fever.  HENT: Negative for congestion, mouth sores, sinus pressure, sore throat, trouble swallowing and voice change.   Eyes: Positive for discharge, redness and itching. Negative for photophobia and visual disturbance.  Respiratory: Negative for cough, shortness of breath and wheezing.   Musculoskeletal: Negative for back pain, joint swelling, myalgias and neck pain.  Skin: Negative for pallor and rash.  Neurological: Negative for syncope, weakness, numbness and headaches.  Hematological: Negative for adenopathy.      Current Outpatient Prescriptions on File Prior to Visit  Medication Sig Dispense Refill  . cyclobenzaprine (FLEXERIL) 5 MG tablet Take 1 tablet (5 mg total) by mouth 3 (three) times daily as needed for muscle spasms. 30 tablet 1  . HYDROcodone-acetaminophen (NORCO/VICODIN) 5-325 MG per tablet Take 1 tablet by mouth every 6 (six) hours as needed for moderate pain. 30 tablet 0  . ibuprofen (ADVIL,MOTRIN) 600 MG tablet TAKE 1 TABLET (600 MG TOTAL) BY MOUTH EVERY 6 (SIX) HOURS AS NEEDED. 30 tablet 0  . Lysine 1000 MG TABS Take by mouth.    . methocarbamol (ROBAXIN) 500 MG tablet TAKE 1 TABLET BY MOUTH TWICE A DAY AS NEEDED FOR MUSCLE  SPASMS 30 tablet 0  . Multiple Vitamin (MULTIVITAMIN) tablet Take 1 tablet by mouth daily.      Marland Kitchen topiramate (TOPAMAX) 50 MG tablet TAKE 1 TABLET BY MOUTH TWICE A DAY 60 tablet 5  . valACYclovir (VALTREX) 1000 MG tablet Take 1,000 mg by mouth daily.      No current facility-administered medications on file prior to visit.      Past Medical History:  Diagnosis Date  . Abnormal Pap smear 2001  . AMA (advanced maternal age) multigravida 35+   . ANEMIA 03/23/2010  . Dyspareunia 2008  . Female infertility   . GALLSTONES 03/23/2010  . H/O constipation 2001  . H/O mumps   . H/O varicella    As a child   . Headache(784.0) 12/18/2006   Frequently  . Heart murmur   . History of rubella    As a child   . Hx: UTI (urinary tract infection) 04/2003  . Migraine   . Mild mitral valve prolapse   . NONSPECIFIC ABNORM FIND RAD&OTH EXAM BILI TRACT 02/02/2008  . OVARIAN CYST 03/23/2010  . Ulcer 2001   Allergies  Allergen Reactions  . Imitrex [Sumatriptan] Anaphylaxis  . Latex Itching  . Penicillins Hives and Swelling    Social History   Social History  . Marital status: Married    Spouse name: N/A  . Number of children: 2  . Years of education: N/A   Occupational History  .  [employer]   Social History Main Topics  .  Smoking status: Never Smoker  . Smokeless tobacco: Never Used  . Alcohol use No  . Drug use: No  . Sexual activity: Yes    Birth control/ protection: Pill   Other Topics Concern  . None   Social History Narrative  . None    Vitals:   01/16/16 1537  BP: 130/78  Pulse: 72  Resp: 16  Temp: 97.8 F (36.6 C)   Body mass index is 30.3 kg/m.     Physical Exam  Nursing note and vitals reviewed. Constitutional: She is oriented to person, place, and time. She appears well-developed. She does not appear ill. No distress.  HENT:  Head: Atraumatic.  Mouth/Throat: Oropharynx is clear and moist and mucous membranes are normal.  Eyes: Conjunctivae  and EOM are normal. Pupils are equal, round, and reactive to light. Right eye exhibits no discharge. Left eye exhibits no discharge. No scleral icterus.  Fundoscopic exam:      The right eye shows no hemorrhage.       The left eye shows no hemorrhage.  Slit lamp exam:      The right eye shows no corneal abrasion.       The left eye shows no corneal abrasion.  No photophobia. Left upper eye lid minimally edematous and tender with palpation.  Lymphadenopathy:       Head (right side): No preauricular adenopathy present.       Head (left side): No preauricular adenopathy present.    She has no cervical adenopathy.  Neurological: She is alert and oriented to person, place, and time. She has normal strength. Coordination and gait normal.  Skin: Skin is warm. No rash noted. No erythema.  Psychiatric: She has a normal mood and affect. Her speech is normal.      ASSESSMENT AND PLAN:     [name] was seen today for eye pain.  Diagnoses and all orders for this visit:  Bilateral conjunctivitis  Bilateral eye symptoms seem to be related to allergic conjunctivitis, today no conjunctival erythema or discharge. Pazeo eyedrops recommended. Instructed to monitor for warning signs. F/U as needed.  -     Olopatadine HCl (PAZEO) 0.7 % SOLN; Apply 1 drop to eye daily.  Hordeolum externum of left upper eyelid   Mild. Local heat a few times per day. I do not think abx is needed at this time.  If worsening or not better in 1-2 weeks, she was instructed to follow.      -[name] advised to return or notify a doctor immediately if symptoms worsen or persist or new concerns arise, she voices understanding.       Merve Hotard G. Swaz[place], MD  [facility]. [place] office.

## 2016-01-16 NOTE — Patient Instructions (Addendum)
  [name] I have seen you today for an acute visit because your primary care provider was not available. Monitor for signs of worsening symptoms and seek immediate medical attention if any concerning/warning symptom as we discussed. If symptoms are not resolved in 1-2 weeks you should schedule a follow up appointment with your doctor, before if symptoms get worse.  Please continue following with your PCP for your other chronic medical problems and be sure you have an appointment already scheduled.  A few things to remember from today's visit:   Bilateral conjunctivitis - Plan: Olopatadine HCl (PAZEO) 0.7 % SOLN  Hordeolum externum of left upper eyelid  Stye A stye is a bump on your eyelid caused by a bacterial infection. A stye can form inside the eyelid (internal stye) or outside the eyelid (external stye). An internal stye may be caused by an infected oil-producing gland inside your eyelid. An external stye may be caused by an infection at the base of your eyelash (hair follicle). Styes are very common. Anyone can get them at any age. They usually occur in just one eye, but you may have more than one in either eye.   SIGNS AND SYMPTOMS  Eyelid pain is the most common symptom of a stye. Internal styes are more painful than external styes. Other signs and symptoms may include:  Painful swelling of your eyelid.  A scratchy feeling in your eye.  Tearing and redness of your eye.  Pus draining from the stye. DIAGNOSIS  Your health care provider may be able to diagnose a stye just by examining your eye. The health care provider may also check to make sure:  You do not have a fever or other signs of a more serious infection. The infection has not spread to other parts of your eye or areas around your eye.  TREATMENT  Most styes will clear up in a few days without treatment. In some cases, local heat a few times per day.  HOME CARE INSTRUCTIONS   Take medicines only as directed  by your health care provider.  Apply a clean, warm compress to your eye for 10 minutes, 4 times a day.  Do not wear contact lenses or eye makeup until your stye has healed.  Do not try to pop or drain the stye. SEEK MEDICAL CARE IF:  You have chills or a fever.  Your stye does not go away after several days.  Your stye affects your vision.  Your eyeball becomes swollen, red, or painful.  Please be sure medication list is accurate. If a new problem present, please set up appointment sooner than planned today.

## 2016-01-16 NOTE — Progress Notes (Signed)
Pre visit review using our clinic review tool, if applicable. No additional management support is needed unless otherwise documented below in the visit note. 

## 2016-04-26 ENCOUNTER — Encounter (HOSPITAL_BASED_OUTPATIENT_CLINIC_OR_DEPARTMENT_OTHER): Payer: Self-pay | Admitting: *Deleted

## 2016-04-26 ENCOUNTER — Emergency Department (HOSPITAL_BASED_OUTPATIENT_CLINIC_OR_DEPARTMENT_OTHER)
Admission: EM | Admit: 2016-04-26 | Discharge: 2016-04-26 | Disposition: A | Discharge status: Home / Self Care | Payer: Managed Care, Other (non HMO) | Attending: Emergency Medicine | Admitting: Emergency Medicine

## 2016-04-26 DIAGNOSIS — R339 Retention of urine, unspecified: Secondary | ICD-10-CM

## 2016-04-26 DIAGNOSIS — R1110 Vomiting, unspecified: Secondary | ICD-10-CM | POA: Diagnosis present

## 2016-04-26 DIAGNOSIS — Z91040 Latex allergy status: Secondary | ICD-10-CM | POA: Insufficient documentation

## 2016-04-26 DIAGNOSIS — R1084 Generalized abdominal pain: Secondary | ICD-10-CM | POA: Diagnosis not present

## 2016-04-26 DIAGNOSIS — R1030 Lower abdominal pain, unspecified: Secondary | ICD-10-CM | POA: Insufficient documentation

## 2016-04-26 LAB — COMPREHENSIVE METABOLIC PANEL
ALT: 13 U/L — AB (ref 14–54)
AST: 19 U/L (ref 15–41)
Albumin: 4.1 g/dL (ref 3.5–5.0)
Alkaline Phosphatase: 44 U/L (ref 38–126)
Anion gap: 6 (ref 5–15)
BUN: 13 mg/dL (ref 6–20)
CHLORIDE: 108 mmol/L (ref 101–111)
CO2: 23 mmol/L (ref 22–32)
CREATININE: 0.79 mg/dL (ref 0.44–1.00)
Calcium: 9.4 mg/dL (ref 8.9–10.3)
GFR calc Af Amer: >60 mL/min (ref >60)
GFR calc non Af Amer: >60 mL/min (ref >60)
Glucose, Bld: 144 mg/dL — ABNORMAL HIGH (ref 65–99)
Potassium: 3.2 mmol/L — ABNORMAL LOW (ref 3.5–5.1)
SODIUM: 137 mmol/L (ref 135–145)
Total Bilirubin: 0.4 mg/dL (ref 0.3–1.2)
Total Protein: 7.4 g/dL (ref 6.5–8.1)

## 2016-04-26 LAB — CBC WITH DIFFERENTIAL/PLATELET
BASOS ABS: 0 10*3/uL (ref 0.0–0.1)
Basophils Relative: 0 %
EOS ABS: 0 10*3/uL (ref 0.0–0.7)
EOS PCT: 0 %
HCT: 32.1 % — ABNORMAL LOW (ref 36.0–46.0)
HEMOGLOBIN: 10.9 g/dL — AB (ref 12.0–15.0)
LYMPHS PCT: 13 %
Lymphs Abs: 1.2 10*3/uL (ref 0.7–4.0)
MCH: 31.1 pg (ref 26.0–34.0)
MCHC: 34 g/dL (ref 30.0–36.0)
MCV: 91.7 fL (ref 78.0–100.0)
Monocytes Absolute: 0.7 10*3/uL (ref 0.1–1.0)
Monocytes Relative: 7 %
NEUTROS PCT: 80 %
Neutro Abs: 7.4 10*3/uL (ref 1.7–7.7)
PLATELETS: 323 10*3/uL (ref 150–400)
RBC: 3.5 MIL/uL — AB (ref 3.87–5.11)
RDW: 13.1 % (ref 11.5–15.5)
WBC: 9.3 10*3/uL (ref 4.0–10.5)

## 2016-04-26 LAB — URINE MICROSCOPIC-ADD ON

## 2016-04-26 LAB — URINALYSIS, ROUTINE W REFLEX MICROSCOPIC
Bilirubin Urine: NEGATIVE
Glucose, UA: NEGATIVE mg/dL
Ketones, ur: NEGATIVE mg/dL
LEUKOCYTES UA: NEGATIVE
NITRITE: NEGATIVE
PROTEIN: NEGATIVE mg/dL
Specific Gravity, Urine: 1.014 (ref 1.005–1.030)
pH: 8 (ref 5.0–8.0)

## 2016-04-26 LAB — LIPASE, BLOOD: Lipase: 14 U/L (ref 11–51)

## 2016-04-26 MED ORDER — FENTANYL CITRATE (PF) 100 MCG/2ML IJ SOLN
75.0000 ug | Freq: Once | INTRAMUSCULAR | Status: AC
  Administered 2016-04-26: 75 ug via INTRAVENOUS
  Filled 2016-04-26: qty 2

## 2016-04-26 MED ORDER — POLYETHYLENE GLYCOL 3350 17 GM/SCOOP PO POWD: 17.0000 g | Freq: Every day | ORAL | 0 refills | Status: DC

## 2016-04-26 MED ORDER — ONDANSETRON HCL 4 MG/2ML IJ SOLN
4.0000 mg | Freq: Once | INTRAMUSCULAR | Status: AC
  Administered 2016-04-26: 4 mg via INTRAVENOUS
  Filled 2016-04-26: qty 2

## 2016-04-26 NOTE — ED Notes (Signed)
Leg bag being placed by EMT Kayla.

## 2016-04-26 NOTE — ED Notes (Signed)
Bladder scan showed 646 ML in bladder.

## 2016-04-26 NOTE — ED Notes (Signed)
Pt verbalized understanding of discharge instructions and denies any further questions at this time.   

## 2016-04-26 NOTE — Discharge Instructions (Signed)
Take miralax for constipation as needed.  Keep foley in and follow-up either with your gynecologist or urologist (listed above) for follow-up in 3-4 days for trial of urination and foley catheter removal.  Return without fail for worsening symptoms, including fever, intractable vomiting, worsening abdominal pain or any other symptoms concerning to you.

## 2016-04-26 NOTE — ED Provider Notes (Signed)
[facility]   CSN: [id number] Arrival date & time: 04/26/16  1212     History   Chief Complaint Chief Complaint  Patient presents with  . Abdominal Pain    HPI [name] is a 55 y.o. female.  HPI 55 year old female who presents with urinary retention. She has a history of constipation, cholelithiasis, cesarean section, laparoscopic cystectomy, and laparoscopic ablation of renal mass. States that she has been constipated recently, which is not unusual for her. Took a dose of magnesium citrate yesterday, was able to have a bowel movement at 4 AM today. States that around 6:00 yesterday afternoon with the last time she was able to urinate and he did note urinary frequency prior to this. States that since then she has had significant suprapubic abdominal discomfort and urinary retention. History of urinary retention with her last pregnancy 8 years ago, and prior history of UTI. No fevers. She did have episode of vomiting after taking magnesium citrate yesterday, but no further nausea or vomiting. No other new medications or OTC medications.   Past Medical History:  Diagnosis Date  . Abnormal Pap smear 2001  . AMA (advanced maternal age) multigravida 35+   . ANEMIA 03/23/2010  . Dyspareunia 2008  . Female infertility   . GALLSTONES 03/23/2010  . H/O constipation 2001  . H/O mumps   . H/O varicella    As a child   . Headache(784.0) 12/18/2006   Frequently  . Heart murmur   . History of rubella    As a child   . Hx: UTI (urinary tract infection) 04/2003  . Migraine   . Mild mitral valve prolapse   . NONSPECIFIC ABNORM FIND RAD&OTH EXAM BILI TRACT 02/02/2008  . OVARIAN CYST 03/23/2010  . Ulcer (HCC) 2001    Patient Active Problem List   Diagnosis Date Noted  . ANEMIA 03/23/2010  . GALLSTONES 03/23/2010  . OVARIAN CYST 03/23/2010  . NONSPECIFIC ABNORM FIND RAD&OTH EXAM BILI TRACT 02/02/2008  . Headache(784.0) 12/18/2006    Past Surgical  History:  Procedure Laterality Date  . CESAREAN SECTION     x 2  . LAPAROSCOPIC ABLATION RENAL MASS    . LAPAROSCOPIC OVARIAN CYSTECTOMY    . WISDOM TOOTH EXTRACTION      OB History    Gravida Para Term Preterm AB Living   2 2 2     2    SAB TAB Ectopic Multiple Live Births           2       Home Medications    Prior to Admission medications   Medication Sig Start Date End Date Taking? Authorizing Provider  cyclobenzaprine (FLEXERIL) 5 MG tablet Take 1 tablet (5 mg total) by mouth 3 (three) times daily as needed for muscle spasms. 10/10/15   Roddie Mc, FNP  HYDROcodone-acetaminophen (NORCO/VICODIN) 5-325 MG per tablet Take 1 tablet by mouth every 6 (six) hours as needed for moderate pain. 02/10/15   Gordy Savers, MD  ibuprofen (ADVIL,MOTRIN) 600 MG tablet TAKE 1 TABLET (600 MG TOTAL) BY MOUTH EVERY 6 (SIX) HOURS AS NEEDED. 11/17/15   Roddie Mc, FNP  Lysine 1000 MG TABS Take by mouth.    Historical Provider, MD  methocarbamol (ROBAXIN) 500 MG tablet TAKE 1 TABLET BY MOUTH TWICE A DAY AS NEEDED FOR MUSCLE SPASMS 03/31/15   Glendale Chard, DO  Multiple Vitamin (MULTIVITAMIN) tablet Take 1 tablet by mouth daily.      Historical Provider, MD  Olopatadine HCl (PAZEO) 0.7 % SOLN Apply 1 drop to eye daily. 01/16/16   Betty G SwazilandJordan, MD  polyethylene glycol powder (GLYCOLAX/MIRALAX) powder Take 17 g by mouth daily. 04/26/16   Lavera Guiseana Duo Deloma Spindle, MD  topiramate (TOPAMAX) 50 MG tablet TAKE 1 TABLET BY MOUTH TWICE A DAY 10/14/15   Gordy SaversPeter F Kwiatkowski, MD  valACYclovir (VALTREX) 1000 MG tablet Take 1,000 mg by mouth daily.  11/15/13   Historical Provider, MD    Family History Family History  Problem Relation Age of Onset  . Hypertension Mother   . Heart disease Father   . Hypertension Maternal Aunt   . Asthma Maternal Aunt   . Diabetes Maternal Aunt   . Colon cancer Neg Hx   . Esophageal cancer Neg Hx   . Stomach cancer Neg Hx   . Rectal cancer Neg Hx     Social History Social  History  Substance Use Topics  . Smoking status: Never Smoker  . Smokeless tobacco: Never Used  . Alcohol use No     Allergies   Imitrex [sumatriptan]; Latex; and Penicillins   Review of Systems Review of Systems 10/14 systems reviewed and are negative other than those stated in the HPI   Physical Exam Updated Vital Signs BP (!) 103/50 (BP Location: Left Arm)   Pulse 62   Temp 98.2 F (36.8 C) (Oral)   Resp 18   SpO2 100%   Physical Exam Physical Exam  Nursing note and vitals reviewed. Constitutional: Well developed, well nourished, non-toxic, and in no acute distress Head: Normocephalic and atraumatic.  Mouth/Throat: Oropharynx is clear and moist.  Neck: Normal range of motion. Neck supple.  Cardiovascular: Normal rate and regular rhythm.   Pulmonary/Chest: Effort normal and breath sounds normal.  Abdominal: Soft. There is diffuse abdominal discomfort, worse supapubically. There is no rebound and no guarding. Normal rectal tone. No fecal impaction.  Musculoskeletal: Normal range of motion.  Neurological: Alert, no facial droop, fluent speech, moves all extremities symmetrically Skin: Skin is warm and dry.  Psychiatric: Cooperative   ED Treatments / Results  Labs (all labs ordered are listed, but only abnormal results are displayed) Labs Reviewed  URINALYSIS, ROUTINE W REFLEX [facility]CROSCOPIC (NOT [facility]) - Abnormal; Notable for the following:       Result Value   APPearance CLOUDY (*)    Hgb urine dipstick SMALL (*)    All other components within normal limits  CBC WITH DIFFERENTIAL/PLATELET - Abnormal; Notable for the following:    RBC 3.50 (*)    Hemoglobin 10.9 (*)    HCT 32.1 (*)    All other components within normal limits  COMPREHENSIVE METABOLIC PANEL - Abnormal; Notable for the following:    Potassium 3.2 (*)    Glucose, Bld 144 (*)    ALT 13 (*)    All other components within normal limits  URINE MICROSCOPIC-ADD ON - Abnormal; Notable for the  following:    Squamous Epithelial / LPF 0-5 (*)    Bacteria, UA RARE (*)    All other components within normal limits  LIPASE, BLOOD    EKG  EKG Interpretation None       Radiology No results found.  Procedures Procedures (including critical care time)  Medications Ordered in ED Medications  fentaNYL (SUBLIMAZE) injection 75 mcg (75 mcg Intravenous Given 04/26/16 1316)  ondansetron (ZOFRAN) injection 4 mg (4 mg Intravenous Given 04/26/16 1316)     Initial Impression / Assessment and Plan / ED Course  I have reviewed the triage vital signs and the nursing notes.  Pertinent labs & imaging results that were available during my care of the patient were reviewed by me and considered in my medical decision making (see chart for details).  Clinical Course     Presenting with abdominal pain 2/2 urinary retention, likely from constipation. With retention of > 600 cc of urine on bladder scan. Total of 750 cc urine output per foley catheter. Once foley placed, with resolution of abdominal pain and soft/benign abdomen on exam. Renal function normal.  No medications recently to cause retention, no evidence of infection on UA. Felt to be likely due to her severe constipation. No evidence of fecal impaction.  No low back pain, neuro complaints or abnormal rectal tone to suggest spinal cord process.   Patient given urology referral as needed, but states that her gynecologist also knows about this issue and can follow-up regarding trial of urination Foley catheter removal. Strict return and follow-up instructions reviewed. She expressed understanding of all discharge instructions and felt comfortable with the plan of care.    Final Clinical Impressions(s) / ED Diagnoses   Final diagnoses:  Urinary retention    New Prescriptions New Prescriptions   POLYETHYLENE GLYCOL POWDER (GLYCOLAX/MIRALAX) POWDER    Take 17 g by mouth daily.     Lavera Guiseana Duo Angelgabriel Willmore, MD 04/26/16 [phone]

## 2016-04-26 NOTE — ED Triage Notes (Signed)
Abdominal pain. She has been constipated. Unable to have a BM followed by unable to urinate and legs have been numb. She took a laxative yesterday and had a BM at 4am. No relief of urinary retention.

## 2016-04-26 NOTE — ED Notes (Signed)
ED Provider at bedside. 

## 2016-04-29 ENCOUNTER — Encounter (HOSPITAL_BASED_OUTPATIENT_CLINIC_OR_DEPARTMENT_OTHER): Payer: Self-pay | Admitting: Emergency Medicine

## 2016-04-29 ENCOUNTER — Emergency Department (HOSPITAL_BASED_OUTPATIENT_CLINIC_OR_DEPARTMENT_OTHER)
Admission: EM | Admit: 2016-04-29 | Discharge: 2016-04-29 | Disposition: A | Discharge status: Home / Self Care | Payer: Managed Care, Other (non HMO) | Attending: Emergency Medicine | Admitting: Emergency Medicine

## 2016-04-29 DIAGNOSIS — K644 Residual hemorrhoidal skin tags: Secondary | ICD-10-CM | POA: Diagnosis not present

## 2016-04-29 DIAGNOSIS — T83098A Other mechanical complication of other indwelling urethral catheter, initial encounter: Secondary | ICD-10-CM | POA: Insufficient documentation

## 2016-04-29 DIAGNOSIS — Z79899 Other long term (current) drug therapy: Secondary | ICD-10-CM | POA: Insufficient documentation

## 2016-04-29 DIAGNOSIS — Z791 Long term (current) use of non-steroidal anti-inflammatories (NSAID): Secondary | ICD-10-CM | POA: Diagnosis not present

## 2016-04-29 DIAGNOSIS — R300 Dysuria: Secondary | ICD-10-CM | POA: Diagnosis present

## 2016-04-29 DIAGNOSIS — Y828 Other medical devices associated with adverse incidents: Secondary | ICD-10-CM | POA: Diagnosis not present

## 2016-04-29 DIAGNOSIS — T839XXA Unspecified complication of genitourinary prosthetic device, implant and graft, initial encounter: Secondary | ICD-10-CM

## 2016-04-29 LAB — URINE MICROSCOPIC-ADD ON: WBC, UA: NONE SEEN WBC/hpf (ref 0–5)

## 2016-04-29 LAB — URINALYSIS, ROUTINE W REFLEX MICROSCOPIC
Bilirubin Urine: NEGATIVE
Glucose, UA: NEGATIVE mg/dL
Ketones, ur: NEGATIVE mg/dL
LEUKOCYTES UA: NEGATIVE
NITRITE: NEGATIVE
PH: 7.5 (ref 5.0–8.0)
Protein, ur: NEGATIVE mg/dL
Specific Gravity, Urine: 1.015 (ref 1.005–1.030)

## 2016-04-29 LAB — PREGNANCY, URINE: Preg Test, Ur: NEGATIVE

## 2016-04-29 MED ORDER — SODIUM CHLORIDE 0.9 % IV BOLUS (SEPSIS)
1000.0000 mL | Freq: Once | INTRAVENOUS | Status: AC
  Administered 2016-04-29: 1000 mL via INTRAVENOUS

## 2016-04-29 NOTE — ED Notes (Signed)
EDP into room 

## 2016-04-29 NOTE — ED Notes (Signed)
EDMD at bedside

## 2016-04-29 NOTE — ED Provider Notes (Signed)
[facility]   CSN: [id number] Arrival date & time: 04/29/16  1514  By signing my name below, I, Arianna Nassar, attest that this documentation has been prepared under the direction and in the presence of Alvira MondayErin Elzena Muston, MD.  Electronically Signed: Octavia HeirArianna Nassar, ED Scribe. 04/29/16. 4:19 PM.    History   Chief Complaint Chief Complaint  Patient presents with  . Dysuria    The history is provided by the patient. No language interpreter was used.   HPI Comments: [name] is a 55 y.o. female who has a PMhx of spinal stenosis presents to the Emergency Department complaining of sudden onset, moderate, dysuria x one night. She has associated blood when wiping, bilateral flank pain, and nausea. Pt had a foley catheter placed on 11/16 after having urinary retention. Pt says her urine looks more cloudy than normal in the foley bag. She notes having blood on her pantiliner and when she wiped but none in the foley bag. Pt says that yesterday had small bowel incontinence when she passed flatus, however has otherwise had a normal BM she could feel since then. She has a follow up with urology on 11/21. She has a hx of foley catheter placement in the past but did not have any complications. Denies abdominal pain,numbness/weakness in legs, bladder incontinence, vomiting.  Reports she has had some higher back pain, denies change in her chronic low back pain and does state she has significant spinal stenosis and disc bulging.   PCP: Rogelia BogaKWIATKOWSKI,PETER FRANK, MD  Past Medical History:  Diagnosis Date  . Abnormal Pap smear 2001  . AMA (advanced maternal age) multigravida 35+   . ANEMIA 03/23/2010  . Dyspareunia 2008  . Female infertility   . GALLSTONES 03/23/2010  . H/O constipation 2001  . H/O mumps   . H/O varicella    As a child   . Headache(784.0) 12/18/2006   Frequently  . Heart murmur   . History of rubella    As a child   . Hx: UTI (urinary tract infection)  04/2003  . Migraine   . Mild mitral valve prolapse   . NONSPECIFIC ABNORM FIND RAD&OTH EXAM BILI TRACT 02/02/2008  . OVARIAN CYST 03/23/2010  . Ulcer (HCC) 2001    Patient Active Problem List   Diagnosis Date Noted  . ANEMIA 03/23/2010  . GALLSTONES 03/23/2010  . OVARIAN CYST 03/23/2010  . NONSPECIFIC ABNORM FIND RAD&OTH EXAM BILI TRACT 02/02/2008  . Headache(784.0) 12/18/2006    Past Surgical History:  Procedure Laterality Date  . CESAREAN SECTION     x 2  . LAPAROSCOPIC ABLATION RENAL MASS    . LAPAROSCOPIC OVARIAN CYSTECTOMY    . WISDOM TOOTH EXTRACTION      OB History    Gravida Para Term Preterm AB Living   2 2 2     2    SAB TAB Ectopic Multiple Live Births           2       Home Medications    Prior to Admission medications   Medication Sig Start Date End Date Taking? Authorizing Provider  cyclobenzaprine (FLEXERIL) 5 MG tablet Take 1 tablet (5 mg total) by mouth 3 (three) times daily as needed for muscle spasms. 10/10/15   Roddie McJulia Kordsmeier, FNP  HYDROcodone-acetaminophen (NORCO/VICODIN) 5-325 MG per tablet Take 1 tablet by mouth every 6 (six) hours as needed for moderate pain. 02/10/15   Gordy SaversPeter F Kwiatkowski, MD  ibuprofen (ADVIL,MOTRIN) 600 MG tablet TAKE  1 TABLET (600 MG TOTAL) BY MOUTH EVERY 6 (SIX) HOURS AS NEEDED. 11/17/15   Roddie McJulia Kordsmeier, FNP  Lysine 1000 MG TABS Take by mouth.    Historical Provider, MD  methocarbamol (ROBAXIN) 500 MG tablet TAKE 1 TABLET BY MOUTH TWICE A DAY AS NEEDED FOR MUSCLE SPASMS 03/31/15   Glendale Chardonika K Patel, DO  Multiple Vitamin (MULTIVITAMIN) tablet Take 1 tablet by mouth daily.      Historical Provider, MD  Olopatadine HCl (PAZEO) 0.7 % SOLN Apply 1 drop to eye daily. 01/16/16   Betty G Swazil[place]Jordan, MD  polyethylene glycol powder (GLYCOLAX/MIRALAX) powder Take 17 g by mouth daily. 04/26/16   Lavera Guiseana Duo Liu, MD  topiramate (TOPAMAX) 50 MG tablet TAKE 1 TABLET BY MOUTH TWICE A DAY 10/14/15   Gordy SaversPeter F Kwiatkowski, MD  valACYclovir (VALTREX) 1000 MG  tablet Take 1,000 mg by mouth daily.  11/15/13   Historical Provider, MD    Family History Family History  Problem Relation Age of Onset  . Hypertension Mother   . Heart disease Father   . Hypertension Maternal Aunt   . Asthma Maternal Aunt   . Diabetes Maternal Aunt   . Colon cancer Neg Hx   . Esophageal cancer Neg Hx   . Stomach cancer Neg Hx   . Rectal cancer Neg Hx     Social History Social History  Substance Use Topics  . Smoking status: Never Smoker  . Smokeless tobacco: Never Used  . Alcohol use No     Allergies   Imitrex [sumatriptan]; Latex; and Penicillins   Review of Systems Review of Systems  Constitutional: Negative for fever.  HENT: Negative for sore throat.   Eyes: Negative for visual disturbance.  Respiratory: Negative for cough and shortness of breath.   Cardiovascular: Negative for chest pain.  Gastrointestinal: Positive for nausea. Negative for abdominal pain and vomiting.  Genitourinary: Positive for difficulty urinating and flank pain.  Musculoskeletal: Positive for back pain. Negative for neck pain.  Skin: Negative for rash.  Neurological: Negative for syncope, weakness, numbness and headaches.     Physical Exam Updated Vital Signs BP 117/59 (BP Location: Left Arm)   Pulse 60   Temp 98.3 F (36.8 C) (Oral)   Resp 20   Ht 5' (1.524 m)   Wt 150 lb (68 kg)   SpO2 100%   BMI 29.29 kg/m   Physical Exam  Constitutional: She is oriented to person, place, and time. She appears well-developed and well-nourished.  HENT:  Head: Normocephalic.  Eyes: EOM are normal.  Neck: Normal range of motion.  Cardiovascular: Normal rate, regular rhythm and normal heart sounds.   Pulses equal  Pulmonary/Chest: Effort normal and breath sounds normal. No respiratory distress.  Abdominal: She exhibits no distension.  Genitourinary: Rectal exam shows external hemorrhoid. Rectal exam shows anal tone normal (normal tone and sensation).  Genitourinary  Comments: Normal rectal tone  Musculoskeletal: Normal range of motion.  Neurological: She is alert and oriented to person, place, and time. She has normal strength. No sensory deficit. GCS eye subscore is 4. GCS verbal subscore is 5. GCS motor subscore is 6.  5/5 strength, normal sensation lower extremities  Psychiatric: She has a normal mood and affect.  Nursing note and vitals reviewed.    ED Treatments / Results  DIAGNOSTIC STUDIES: Oxygen Saturation is 100% on RA, normal by my interpretation.  COORDINATION OF CARE:  4:05 PM Discussed treatment plan with pt at bedside and pt agreed to plan. Rectal exam  will be performed.  Labs (all labs ordered are listed, but only abnormal results are displayed) Labs Reviewed  URINALYSIS, ROUTINE W REFLEX MICROSCOPIC (NOT AT [place]) - Abnormal; Notable for the following:       Result Value   APPearance CLOUDY (*)    Hgb urine dipstick LARGE (*)    All other components within normal limits  URINE MICROSCOPIC-ADD ON - Abnormal; Notable for the following:    Squamous Epithelial / LPF 0-5 (*)    Bacteria, UA FEW (*)    All other components within normal limits  PREGNANCY, URINE    EKG  EKG Interpretation None       Radiology No results found.  Procedures Procedures (including critical care time)  Medications Ordered in ED Medications  sodium chloride 0.9 % bolus 1,000 mL (0 mLs Intravenous Stopped 04/29/16 1759)     Initial Impression / Assessment and Plan / ED Course  I have reviewed the triage vital signs and the nursing notes.  Pertinent labs & imaging results that were available during my care of the patient were reviewed by me and considered in my medical decision making (see chart for details).  Clinical Course   55 year old female with a history of emergency department visit November 16 with concern for constipation and urinary retention with Foley placement, presents with concern for irritation from around the Foley with  bleeding, and cloudy urine.  Patient has normal rectal tone, normal strength bilaterally, normal sensation, and have low suspicion for cauda equina. Foley catheter was removed, and she is given IV fluids for a voiding trial with ability to urinate.  Urinalysis shows hematuria however no signs of infection.  Patient reports continuing symptoms of feeling of urgency, and frequency. Postvoid residual was obtained which showed 100 mL in the bladder. Given this, feel is appropriate for discharge without the Foley, with continued close follow-up with urology this Tuesday. Discussed  that have low suspicion at this time for cauda equina given normal strength, normal rectal tone, normal sensation and less than 150 mL an postvoid residual, however given her history of back problems, recommend follow-up with her orthopedic physician. Discussed reasons to return to the emergency department in detail. Patient discharged in stable condition with understanding of reasons to return.   I personally performed the services described in this documentation, which was scribed in my presence. The recorded information has been reviewed and is accurate.  Final Clinical Impressions(s) / ED Diagnoses   Final diagnoses:  Foley catheter problem, initial encounter [facility])    New Prescriptions Discharge Medication List as of 04/29/2016  8:07 PM       Alvira Monday, MD 04/30/16 [phone]

## 2016-04-29 NOTE — ED Triage Notes (Addendum)
Patient reports she had a foley catheter placed at last ER visit.  States that she made an appt with urology for follow up Tuesday.  States urine is now cloudy and bloody as well as painful. Reports this began last night.

## 2016-05-17 ENCOUNTER — Encounter: Payer: Self-pay | Admitting: Internal Medicine

## 2016-05-24 ENCOUNTER — Emergency Department (HOSPITAL_BASED_OUTPATIENT_CLINIC_OR_DEPARTMENT_OTHER)
Admission: EM | Admit: 2016-05-24 | Discharge: 2016-05-24 | Disposition: A | Discharge status: Home / Self Care | Payer: Managed Care, Other (non HMO) | Attending: Emergency Medicine | Admitting: Emergency Medicine

## 2016-05-24 ENCOUNTER — Encounter (HOSPITAL_BASED_OUTPATIENT_CLINIC_OR_DEPARTMENT_OTHER): Payer: Self-pay

## 2016-05-24 DIAGNOSIS — N3090 Cystitis, unspecified without hematuria: Secondary | ICD-10-CM

## 2016-05-24 DIAGNOSIS — N3091 Cystitis, unspecified with hematuria: Secondary | ICD-10-CM | POA: Diagnosis not present

## 2016-05-24 DIAGNOSIS — Z79899 Other long term (current) drug therapy: Secondary | ICD-10-CM | POA: Insufficient documentation

## 2016-05-24 DIAGNOSIS — Z791 Long term (current) use of non-steroidal anti-inflammatories (NSAID): Secondary | ICD-10-CM | POA: Diagnosis not present

## 2016-05-24 DIAGNOSIS — R319 Hematuria, unspecified: Secondary | ICD-10-CM | POA: Diagnosis present

## 2016-05-24 DIAGNOSIS — R310 Gross hematuria: Secondary | ICD-10-CM

## 2016-05-24 LAB — URINALYSIS, MICROSCOPIC (REFLEX)

## 2016-05-24 LAB — URINALYSIS, ROUTINE W REFLEX MICROSCOPIC
Bilirubin Urine: NEGATIVE
GLUCOSE, UA: NEGATIVE mg/dL
Ketones, ur: NEGATIVE mg/dL
NITRITE: NEGATIVE
PH: 6 (ref 5.0–8.0)
Protein, ur: NEGATIVE mg/dL
Specific Gravity, Urine: 1.025 (ref 1.005–1.030)

## 2016-05-24 MED ORDER — CIPROFLOXACIN HCL 500 MG PO TABS: 500.0000 mg | ORAL_TABLET | Freq: Two times a day (BID) | ORAL | 0 refills | Status: DC

## 2016-05-24 NOTE — ED Provider Notes (Signed)
[facility] Provider Note   CSN: [id number] Arrival date & time: 05/24/16  1905  By signing my name below, I, [name], attest that this documentation has been prepared under the direction and in the presence of Adrienne Mornavid Evaline Waltman, NP. Electronically Signed: Angelene GiovanniEmmanuella Mensah, ED Scribe. 05/24/16. 9:00 PM.   History   Chief Complaint Chief Complaint  Patient presents with  . Hematuria    HPI Comments: [name] is a 55 y.o. female with a hx of spinal stenosis, anemia, UTI, and constipation who presents to the Emergency Department complaining of gradually worsening moderate bilateral back pain with right flank pain onset several days ago. She reports associated hematuria onset today. No alleviating factors noted. Pt has tried NSAIDs PTA with no relief. She has an allergy to Imitrex, latex, and penicillin. Pt has been evaluated twice for urinary symptoms here in the ED and her last visit was on 04/29/16 where she was c/o dysuria after having a Foley catheter placed 3 days prior. She was discharged without the Foley catheter after receiving IV fluids and advised to follow up with Urology. She reports that she followed up with Urology however at that time she no longer had the catheter and did not require any further treatment. She adds that she was given medication but did not need to have it filled. She states that she has a hx of spinal stenosis and denies any significant changes. She also denies any fever, chills, abdominal pain, nausea, vomiting, dysuria, difficulty urinating, numbness/tingling in BLE, weakness, or any other symptoms.   PCP: Dr. Eleonore ChiquitoPeter Kwiatkowski  Ortho: Dr. Delbert HarnessMurphy Wainer   The history is provided by the patient. No language interpreter was used.  Hematuria  This is a new problem. The current episode started 6 to 12 hours ago. The problem has not changed since onset.Pertinent negatives include no abdominal pain. Nothing aggravates the symptoms. Nothing  relieves the symptoms.    Past Medical History:  Diagnosis Date  . Abnormal Pap smear 2001  . AMA (advanced maternal age) multigravida 35+   . ANEMIA 03/23/2010  . Dyspareunia 2008  . Female infertility   . GALLSTONES 03/23/2010  . H/O constipation 2001  . H/O mumps   . H/O varicella    As a child   . Headache(784.0) 12/18/2006   Frequently  . Heart murmur   . History of rubella    As a child   . Hx: UTI (urinary tract infection) 04/2003  . Migraine   . Mild mitral valve prolapse   . NONSPECIFIC ABNORM FIND RAD&OTH EXAM BILI TRACT 02/02/2008  . OVARIAN CYST 03/23/2010  . Ulcer (HCC) 2001    Patient Active Problem List   Diagnosis Date Noted  . ANEMIA 03/23/2010  . GALLSTONES 03/23/2010  . OVARIAN CYST 03/23/2010  . NONSPECIFIC ABNORM FIND RAD&OTH EXAM BILI TRACT 02/02/2008  . Headache(784.0) 12/18/2006    Past Surgical History:  Procedure Laterality Date  . CESAREAN SECTION     x 2  . LAPAROSCOPIC ABLATION RENAL MASS    . LAPAROSCOPIC OVARIAN CYSTECTOMY    . WISDOM TOOTH EXTRACTION      OB History    Gravida Para Term Preterm AB Living   2 2 2     2    SAB TAB Ectopic Multiple Live Births           2       Home Medications    Prior to Admission medications   Medication Sig Start Date End  Date Taking? Authorizing Provider  cyclobenzaprine (FLEXERIL) 5 MG tablet Take 1 tablet (5 mg total) by mouth 3 (three) times daily as needed for muscle spasms. 10/10/15   Roddie McJulia Kordsmeier, FNP  HYDROcodone-acetaminophen (NORCO/VICODIN) 5-325 MG per tablet Take 1 tablet by mouth every 6 (six) hours as needed for moderate pain. 02/10/15   Gordy SaversPeter F Kwiatkowski, MD  ibuprofen (ADVIL,MOTRIN) 600 MG tablet TAKE 1 TABLET (600 MG TOTAL) BY MOUTH EVERY 6 (SIX) HOURS AS NEEDED. 11/17/15   Roddie McJulia Kordsmeier, FNP  Lysine 1000 MG TABS Take by mouth.    Historical Provider, MD  methocarbamol (ROBAXIN) 500 MG tablet TAKE 1 TABLET BY MOUTH TWICE A DAY AS NEEDED FOR MUSCLE SPASMS 03/31/15   Glendale Chardonika  K Patel, DO  Multiple Vitamin (MULTIVITAMIN) tablet Take 1 tablet by mouth daily.      Historical Provider, MD  Olopatadine HCl (PAZEO) 0.7 % SOLN Apply 1 drop to eye daily. 01/16/16   Betty G SwazilandJordan, MD  polyethylene glycol powder (GLYCOLAX/MIRALAX) powder Take 17 g by mouth daily. 04/26/16   Lavera Guiseana Duo Liu, MD  topiramate (TOPAMAX) 50 MG tablet TAKE 1 TABLET BY MOUTH TWICE A DAY 10/14/15   Gordy SaversPeter F Kwiatkowski, MD  valACYclovir (VALTREX) 1000 MG tablet Take 1,000 mg by mouth daily.  11/15/13   Historical Provider, MD    Family History Family History  Problem Relation Age of Onset  . Hypertension Mother   . Heart disease Father   . Hypertension Maternal Aunt   . Asthma Maternal Aunt   . Diabetes Maternal Aunt   . Colon cancer Neg Hx   . Esophageal cancer Neg Hx   . Stomach cancer Neg Hx   . Rectal cancer Neg Hx     Social History Social History  Substance Use Topics  . Smoking status: Never Smoker  . Smokeless tobacco: Never Used  . Alcohol use No     Allergies   Imitrex [sumatriptan]; Latex; and Penicillins   Review of Systems Review of Systems  Constitutional: Negative for chills and fever.  Gastrointestinal: Negative for abdominal pain, nausea and vomiting.  Genitourinary: Positive for hematuria. Negative for difficulty urinating and dysuria.  Musculoskeletal: Positive for back pain.  Neurological: Negative for weakness and numbness.  All other systems reviewed and are negative.    Physical Exam Updated Vital Signs BP 128/71 (BP Location: Left Arm)   Pulse 70   Temp 98.1 F (36.7 C) (Oral)   Resp 18   Ht 5' (1.524 m)   Wt 150 lb (68 kg)   SpO2 100%   BMI 29.29 kg/m   Physical Exam  Constitutional: She is oriented to person, place, and time. She appears well-developed and well-nourished. No distress.  HENT:  Head: Normocephalic and atraumatic.  Eyes: Conjunctivae and EOM are normal.  Neck: Neck supple. No tracheal deviation present.  Cardiovascular: Normal  rate.   Pulmonary/Chest: Effort normal. No respiratory distress.  Abdominal: There is tenderness.  Right sided CVA tenderness   Musculoskeletal: Normal range of motion. She exhibits tenderness.  Bilateral lower back discomfort   Neurological: She is alert and oriented to person, place, and time.  Skin: Skin is warm and dry.  Psychiatric: She has a normal mood and affect. Her behavior is normal.  Nursing note and vitals reviewed.    ED Treatments / Results  DIAGNOSTIC STUDIES: Oxygen Saturation is 100% on RA, normal by my interpretation.    COORDINATION OF CARE: 8:43 PM- Pt advised of plan for treatment and pt  agrees. Pt informed of her lab results. She will receive antibiotics.    Labs (all labs ordered are listed, but only abnormal results are displayed) Labs Reviewed  URINALYSIS, ROUTINE W REFLEX MICROSCOPIC - Abnormal; Notable for the following:       Result Value   Color, Urine AMBER (*)    APPearance CLOUDY (*)    Hgb urine dipstick LARGE (*)    Leukocytes, UA SMALL (*)    All other components within normal limits  URINALYSIS, MICROSCOPIC (REFLEX) - Abnormal; Notable for the following:    Bacteria, UA MANY (*)    Squamous Epithelial / LPF 0-5 (*)    All other components within normal limits    EKG  EKG Interpretation None       Radiology No results found.  Procedures Procedures (including critical care time)  Medications Ordered in ED Medications - No data to display   Initial Impression / Assessment and Plan / ED Course  Adrienne Morn, NP has reviewed the triage vital signs and the nursing notes.  Pertinent labs & imaging results that were available during my care of the patient were reviewed by me and considered in my medical decision making (see chart for details).  Clinical Course   Pt diagnosed with a UTI with hematuria. Pt is afebrile, without tachycardia, hypotension, or other signs of serious infection.  Due to recent urinary catheter, will treat  as complicated cystitis with Pt to be dc home with antibiotics and instructions to follow up with PCP/urology. Discussed return precautions. Pt appears safe for discharge.     Final Clinical Impressions(s) / ED Diagnoses   Final diagnoses:  Gross hematuria  Cystitis    New Prescriptions Discharge Medication List as of 05/24/2016  9:01 PM    START taking these medications   Details  ciprofloxacin (CIPRO) 500 MG tablet Take 1 tablet (500 mg total) by mouth 2 (two) times daily., Starting Thu 05/24/2016, Print       I personally performed the services described in this documentation, which was scribed in my presence. The recorded information has been reviewed and is accurate.    Adrienne Morn, NP 05/28/16 1630    Melene Plan, DO 05/28/16 2324

## 2016-05-24 NOTE — ED Notes (Signed)
EDNP into room. Pt alert, NAD, calm, interactive, resps e/u, speaking in clear complete sentences, no dyspnea noted, c/o hematuria and bilateral low back pain, R>L, 7/10, (denies: fever, nvd, dizziness, cough, congestion, cold sx, other urinary sx), mentions recent recurrent HAs, taking NSAIDS for low back pain. [facility]

## 2016-05-24 NOTE — ED Triage Notes (Signed)
Pt c/o bilateral lower back pain with right flank pain for the last several days, noticed blood in her urine starting today.  Denies painful urination, denies nausea, denies fevers.

## 2016-05-26 LAB — URINE CULTURE: CULTURE: NO GROWTH

## 2016-05-28 ENCOUNTER — Other Ambulatory Visit: Payer: Self-pay | Admitting: Internal Medicine

## 2016-08-03 ENCOUNTER — Encounter: Payer: Self-pay | Admitting: Family Medicine

## 2016-08-03 ENCOUNTER — Ambulatory Visit (INDEPENDENT_AMBULATORY_CARE_PROVIDER_SITE_OTHER): Payer: BLUE CROSS/BLUE SHIELD | Admitting: Family Medicine

## 2016-08-03 VITALS — BP 106/72 | HR 80 | Temp 98.4°F | Wt 153.8 lb

## 2016-08-03 DIAGNOSIS — K219 Gastro-esophageal reflux disease without esophagitis: Secondary | ICD-10-CM

## 2016-08-03 LAB — CBC WITH DIFFERENTIAL/PLATELET
BASOS ABS: 0 {cells}/uL (ref 0–200)
BASOS PCT: 0 %
EOS ABS: 1606 {cells}/uL — AB (ref 15–500)
Eosinophils Relative: 22 %
HCT: 34.5 % — ABNORMAL LOW (ref 35.0–45.0)
HEMOGLOBIN: 11.1 g/dL — AB (ref 11.7–15.5)
Lymphocytes Relative: 26 %
Lymphs Abs: 1898 cells/uL (ref 850–3900)
MCH: 29.8 pg (ref 27.0–33.0)
MCHC: 32.2 g/dL (ref 32.0–36.0)
MCV: 92.7 fL (ref 80.0–100.0)
MPV: 9 fL (ref 7.5–12.5)
Monocytes Absolute: 511 cells/uL (ref 200–950)
Monocytes Relative: 7 %
NEUTROS ABS: 3285 {cells}/uL (ref 1500–7800)
Neutrophils Relative %: 45 %
PLATELETS: 333 10*3/uL (ref 140–400)
RBC: 3.72 MIL/uL — ABNORMAL LOW (ref 3.80–5.10)
RDW: 15 % (ref 11.0–15.0)
WBC: 7.3 10*3/uL (ref 3.8–10.8)

## 2016-08-03 LAB — BASIC METABOLIC PANEL
BUN: 16 mg/dL (ref 7–25)
CHLORIDE: 109 mmol/L (ref 98–110)
CO2: 25 mmol/L (ref 20–31)
CREATININE: 0.88 mg/dL (ref 0.50–1.05)
Calcium: 9.8 mg/dL (ref 8.6–10.4)
Glucose, Bld: 71 mg/dL (ref 65–99)
POTASSIUM: 3.7 mmol/L (ref 3.5–5.3)
Sodium: 142 mmol/L (ref 135–146)

## 2016-08-03 LAB — HEPATIC FUNCTION PANEL
ALT: 10 U/L (ref 6–29)
AST: 13 U/L (ref 10–35)
Albumin: 4.2 g/dL (ref 3.6–5.1)
Alkaline Phosphatase: 53 U/L (ref 33–130)
BILIRUBIN DIRECT: 0.1 mg/dL (ref <=0.2)
BILIRUBIN TOTAL: 0.2 mg/dL (ref 0.2–1.2)
Indirect Bilirubin: 0.1 mg/dL — ABNORMAL LOW (ref 0.2–1.2)
Total Protein: 7.1 g/dL (ref 6.1–8.1)

## 2016-08-03 MED ORDER — OMEPRAZOLE 20 MG PO CPDR: 20.0000 mg | DELAYED_RELEASE_CAPSULE | Freq: Every day | ORAL | 3 refills | Status: DC

## 2016-08-03 NOTE — Progress Notes (Signed)
Subjective:    Patient ID: [name], female    DOB: 11/22/1960, 56 y.o.   MRN: [id number]  HPI  [name] is a 56 year old female who presents today with stomach pain and gas that have been present for 2 weeks. She reports that pain is noted as a burning and can be rated as a five and then can fluctuates to a 10 at times. This is noted as an aching pain. She denies any known triggers. She has a history of abdominal pain that has been intermittent and evaluated in 2009 and 2012. She was evaluated with an abdominal US and this indicated gallstones.  She reports that she was advised to have her gallbladder removed but declined and pain subsided.   She denies significant dyspepsia, hoarseness, dysphagia, or extreme weight loss, melena, or rectal bleeding. She is not taking a  protein pump inhibitor daily. She denies  N/V/D, fever, chills, sweats, chest pain, palpitations, SOB, radiating pain, diaphoresis, jaw/arm pain. She has a history of chronic constipation but has started taken Juice Plus which is an OTC tablet that has provided excellent benefit. She also states that she does remember episodes of GERD in her history and has used omeprazole in the past with great benefit. Treatment at home with gas x has provided limited benefit.    Review of Systems  Constitutional: Negative for chills, fatigue, fever and unexpected weight change.  Respiratory: Negative for cough, chest tightness, shortness of breath and wheezing.   Cardiovascular: Negative for chest pain, palpitations and leg swelling.  Gastrointestinal: Positive for abdominal pain. Negative for blood in stool, diarrhea, nausea and vomiting.       Excess gas  Genitourinary: Negative for dysuria, hematuria and urgency.  Musculoskeletal: Negative for myalgias.  Skin: Negative for rash.  Neurological: Negative for dizziness, light-headedness, numbness and headaches.   Past Medical History:  Diagnosis Date  . Abnormal Pap smear 2001    . AMA (advanced maternal age) multigravida 35+   . ANEMIA 03/23/2010  . Dyspareunia 2008  . Female infertility   . GALLSTONES 03/23/2010  . H/O constipation 2001  . H/O mumps   . H/O varicella    As a child   . Headache(784.0) 12/18/2006   Frequently  . Heart murmur   . History of rubella    As a child   . Hx: UTI (urinary tract infection) 04/2003  . Migraine   . Mild mitral valve prolapse   . NONSPECIFIC ABNORM FIND RAD&OTH EXAM BILI TRACT 02/02/2008  . OVARIAN CYST 03/23/2010  . Ulcer (HCC) 2001     Social History   Social History  . Marital status: Married    Spouse name: N/A  . Number of children: 2  . Years of education: N/A   Occupational History  .  [employer]   Social History Main Topics  . Smoking status: Never Smoker  . Smokeless tobacco: Never Used  . Alcohol use No  . Drug use: No  . Sexual activity: Yes    Birth control/ protection: Pill   Other Topics Concern  . Not on file   Social History Narrative  . No narrative on file    Past Surgical History:  Procedure Laterality Date  . CESAREAN SECTION     x 2  . LAPAROSCOPIC ABLATION RENAL MASS    . LAPAROSCOPIC OVARIAN CYSTECTOMY    . WISDOM TOOTH EXTRACTION      Family History  Problem Relation Age of Onset  .  Hypertension Mother   . Heart disease Father   . Hypertension Maternal Aunt   . Asthma Maternal Aunt   . Diabetes Maternal Aunt   . Colon cancer Neg Hx   . Esophageal cancer Neg Hx   . Stomach cancer Neg Hx   . Rectal cancer Neg Hx     Allergies  Allergen Reactions  . Imitrex [Sumatriptan] Anaphylaxis  . Latex Itching  . Penicillins Hives and Swelling    Current Outpatient Prescriptions on File Prior to Visit  Medication Sig Dispense Refill  . cyclobenzaprine (FLEXERIL) 5 MG tablet Take 1 tablet (5 mg total) by mouth 3 (three) times daily as needed for muscle spasms. 30 tablet 1  . HYDROcodone-acetaminophen (NORCO/VICODIN) 5-325 MG per tablet Take 1 tablet by  mouth every 6 (six) hours as needed for moderate pain. 30 tablet 0  . ibuprofen (ADVIL,MOTRIN) 600 MG tablet TAKE 1 TABLET (600 MG TOTAL) BY MOUTH EVERY 6 (SIX) HOURS AS NEEDED. 30 tablet 0  . Lysine 1000 MG TABS Take by mouth.    . methocarbamol (ROBAXIN) 500 MG tablet TAKE 1 TABLET BY MOUTH TWICE A DAY AS NEEDED FOR MUSCLE SPASMS 30 tablet 0  . Multiple Vitamin (MULTIVITAMIN) tablet Take 1 tablet by mouth daily.      . Olopatadine HCl (PAZEO) 0.7 % SOLN Apply 1 drop to eye daily. 1 Bottle 2  . polyethylene glycol powder (GLYCOLAX/MIRALAX) powder Take 17 g by mouth daily. 500 g 0  . topiramate (TOPAMAX) 50 MG tablet TAKE 1 TABLET BY MOUTH TWICE A DAY 60 tablet 5  . valACYclovir (VALTREX) 1000 MG tablet Take 1,000 mg by mouth daily.      No current facility-administered medications on file prior to visit.     BP 106/72 (BP Location: Left Arm, Patient Position: Sitting, Cuff Size: Normal)   Pulse 80   Temp 98.4 F (36.9 C) (Oral)   Wt 153 lb 12.8 oz (69.8 kg)   SpO2 98%   BMI 30.04 kg/m       Objective:   Physical Exam  Constitutional: She is oriented to person, place, and time. She appears well-developed and well-nourished.  Eyes: Pupils are equal, round, and reactive to light. No scleral icterus.  Neck: Neck supple.  Cardiovascular: Normal rate, regular rhythm and intact distal pulses.   Pulmonary/Chest: Effort normal and breath sounds normal. She has no wheezes. She has no rales.  Abdominal: Soft. Bowel sounds are normal. There is no tenderness. There is no rebound and no guarding.  Negative McBurney's point, negative Psoas and obturator  Musculoskeletal: She exhibits no edema.  Lymphadenopathy:    She has no cervical adenopathy.  Neurological: She is alert and oriented to person, place, and time.  Skin: Skin is warm and dry. No rash noted.  Psychiatric: She has a normal mood and affect. Her behavior is normal. Judgment and thought content normal.       Assessment & Plan:    1. Gastroesophageal reflux disease, esophagitis presence not specified Exam and history support trial of omeprazole for symptoms with avoidance of triggers.  Will obtain CBC, BMP, Hepatic Function, and Lipase today. Low suspicion for pancreatitis. Exam is reassuring today. Further advised her to follow up with her provider next week after trial of medication. We also discussed that the next step is likely an abdominal US. Advised patient to seek care immediately if she develops chest pain, worsening pain, pain with N/V, diaphoresis, radiating pain, or SOB. She voiced understanding and agreed  with plan. - omeprazole (PRILOSEC) 20 MG capsule; Take 1 capsule (20 mg total) by mouth daily.  Dispense: 30 capsule; Refill: 3 - Basic metabolic panel - CBC with Differential/Platelet - Hepatic function panel  Roddie Mc, FNP-C

## 2016-08-03 NOTE — Patient Instructions (Signed)
Please take medication as prescribed and avoid foods that trigger reflux. Foods such as caffeine, nicotine, aspirin, aleve, peppermint.    We have ordered labs or studies at this visit. It can take up to 1-2 weeks for results and processing. IF results require follow up or explanation, we will call you with instructions. Clinically stable results will be released to your [facility]. If you have not heard from [name] or cannot find your results in [facility]CHART in 2 weeks please contact our office at [phone].  If you are not yet signed up for [facility], please consider signing up   Please schedule a follow up with your provider in 3 to 4 days if symptoms do not improve, worsen, or you develop new symptoms. Also, please seek medical attention immediately if you notice any chest pain, palpitations, SOB, or pain with nausea and vomiting.   Food Choices for Gastroesophageal Reflux Disease, Adult When you have gastroesophageal reflux disease (GERD), the foods you eat and your eating habits are very important. Choosing the right foods can help ease your discomfort. What guidelines do I need to follow?  Choose fruits, vegetables, whole grains, and low-fat dairy products.  Choose low-fat meat, fish, and poultry.  Limit fats such as oils, salad dressings, butter, nuts, and avocado.  Keep a food diary. This helps you identify foods that cause symptoms.  Avoid foods that cause symptoms. These may be different for everyone.  Eat small meals often instead of 3 large meals a day.  Eat your meals slowly, in a place where you are relaxed.  Limit fried foods.  Cook foods using methods other than frying.  Avoid drinking alcohol.  Avoid drinking large amounts of liquids with your meals.  Avoid bending over or lying down until 2-3 hours after eating. What foods are not recommended? These are some foods and drinks that may make your symptoms worse: Vegetables  Tomatoes. Tomato juice. Tomato and spaghetti  sauce. Chili peppers. Onion and garlic. Horseradish. Fruits  Oranges, grapefruit, and lemon (fruit and juice). Meats  High-fat meats, fish, and poultry. This includes hot dogs, ribs, ham, sausage, salami, and bacon. Dairy  Whole milk and chocolate milk. Sour cream. Cream. Butter. Ice cream. Cream cheese. Drinks  Coffee and tea. Bubbly (carbonated) drinks or energy drinks. Condiments  Hot sauce. Barbecue sauce. Sweets/Desserts  Chocolate and cocoa. Donuts. Peppermint and spearmint. Fats and Oils  High-fat foods. This includes [place] fries and potato chips. Other  Vinegar. Strong spices. This includes black pepper, white pepper, red pepper, cayenne, curry powder, cloves, ginger, and chili powder. The items listed above may not be a complete list of foods and drinks to avoid. Contact your dietitian for more information.  This information is not intended to replace advice given to you by your health care provider. Make sure you discuss any questions you have with your health care provider. Document Released: 11/27/2011 Document Revised: 11/03/2015 Document Reviewed: 04/01/2013 Elsevier Interactive Patient Education  2017 [employer]

## 2016-08-04 LAB — LIPASE: Lipase: 17 U/L (ref 7–60)

## 2016-08-14 ENCOUNTER — Ambulatory Visit: Payer: BLUE CROSS/BLUE SHIELD | Admitting: Internal Medicine

## 2016-10-29 ENCOUNTER — Other Ambulatory Visit: Payer: Self-pay | Admitting: Family Medicine

## 2016-10-29 DIAGNOSIS — H109 Unspecified conjunctivitis: Secondary | ICD-10-CM

## 2016-10-31 ENCOUNTER — Other Ambulatory Visit: Payer: Self-pay | Admitting: Internal Medicine

## 2016-12-03 ENCOUNTER — Other Ambulatory Visit: Payer: Self-pay | Admitting: Obstetrics and Gynecology

## 2016-12-03 DIAGNOSIS — Z1231 Encounter for screening mammogram for malignant neoplasm of breast: Secondary | ICD-10-CM

## 2017-01-01 ENCOUNTER — Ambulatory Visit
Admission: RE | Admit: 2017-01-01 | Discharge: 2017-01-01 | Disposition: A | Discharge status: Home / Self Care | Payer: BLUE CROSS/BLUE SHIELD | Source: Ambulatory Visit | Attending: Obstetrics and Gynecology | Admitting: Obstetrics and Gynecology

## 2017-01-01 DIAGNOSIS — Z1231 Encounter for screening mammogram for malignant neoplasm of breast: Secondary | ICD-10-CM

## 2017-02-04 ENCOUNTER — Other Ambulatory Visit: Payer: Self-pay | Admitting: Internal Medicine

## 2017-04-26 ENCOUNTER — Telehealth: Payer: Self-pay | Admitting: Family Medicine

## 2017-04-26 NOTE — Telephone Encounter (Signed)
Copied from CRM [id number]. Topic: General - Other >> Apr 26, 2017  2:28 PM Leafy Roobinson, Norma J wrote: Reason for CRM: Pt is calling and would like to have hep b vaccine due to [place]

## 2017-04-29 NOTE — Telephone Encounter (Signed)
Okay to initiate hepatitis B vaccination per patient request

## 2017-04-29 NOTE — Telephone Encounter (Signed)
Left a detailed message to make a nurse visit appointment to initiate hepatitis B vaccination per Dr KKirtland Bouchard

## 2017-04-29 NOTE — Telephone Encounter (Signed)
Please advise 

## 2017-05-07 ENCOUNTER — Other Ambulatory Visit: Payer: Self-pay | Admitting: Internal Medicine

## 2017-05-13 ENCOUNTER — Ambulatory Visit: Payer: BLUE CROSS/BLUE SHIELD | Admitting: Internal Medicine

## 2017-05-14 ENCOUNTER — Ambulatory Visit: Payer: BLUE CROSS/BLUE SHIELD | Admitting: Internal Medicine

## 2017-05-16 ENCOUNTER — Encounter: Payer: Self-pay | Admitting: Internal Medicine

## 2017-05-16 ENCOUNTER — Ambulatory Visit (INDEPENDENT_AMBULATORY_CARE_PROVIDER_SITE_OTHER): Payer: BLUE CROSS/BLUE SHIELD | Admitting: Internal Medicine

## 2017-05-16 VITALS — BP 118/62 | HR 77 | Temp 97.8°F | Ht 60.0 in | Wt 152.8 lb

## 2017-05-16 DIAGNOSIS — Z8669 Personal history of other diseases of the nervous system and sense organs: Secondary | ICD-10-CM | POA: Diagnosis not present

## 2017-05-16 NOTE — Patient Instructions (Signed)
Return in 1 year for an annual exam

## 2017-05-16 NOTE — Progress Notes (Signed)
Subjective:    Patient ID: [name], female    DOB: 06/03/1961, 56 y.o.   MRN: [id number]  HPI  56 year old patient who is seen infrequently.  She had heard about a hepatitis outbreak in was here today for consideration of a hepatitis B vaccine.  Current indications reviewed and discussed and it was elected not to vaccinate for hepatitis B.  She is not a high risk individual A flu vaccine was encouraged however but she declined She is also seen for general follow-up of her migraine headaches.  She remains on Topamax twice daily with very nice benefit.  Past Medical History:  Diagnosis Date  . Abnormal Pap smear 2001  . AMA (advanced maternal age) multigravida 35+   . ANEMIA 03/23/2010  . Dyspareunia 2008  . Female infertility   . GALLSTONES 03/23/2010  . H/O constipation 2001  . H/O mumps   . H/O varicella    As a child   . Headache(784.0) 12/18/2006   Frequently  . Heart murmur   . History of rubella    As a child   . Hx: UTI (urinary tract infection) 04/2003  . Migraine   . Mild mitral valve prolapse   . NONSPECIFIC ABNORM FIND RAD&OTH EXAM BILI TRACT 02/02/2008  . OVARIAN CYST 03/23/2010  . Ulcer 2001     Social History   Socioeconomic History  . Marital status: Married    Spouse name: Not on file  . Number of children: 2  . Years of education: Not on file  . Highest education level: Not on file  Social Needs  . Financial resource strain: Not on file  . Food insecurity - worry: Not on file  . Food insecurity - inability: Not on file  . Transportation needs - medical: Not on file  . Transportation needs - non-medical: Not on file  Occupational History    Employer: [employer]  Tobacco Use  . Smoking status: Never Smoker  . Smokeless tobacco: Never Used  Substance and Sexual Activity  . Alcohol use: No    Alcohol/week: 0.0 oz  . Drug use: No  . Sexual activity: Yes    Birth control/protection: Pill  Other Topics Concern  . Not on file    Social History Narrative  . Not on file    Past Surgical History:  Procedure Laterality Date  . CESAREAN SECTION     x 2  . LAPAROSCOPIC ABLATION RENAL MASS    . LAPAROSCOPIC OVARIAN CYSTECTOMY    . WISDOM TOOTH EXTRACTION      Family History  Problem Relation Age of Onset  . Hypertension Mother   . Heart disease Father   . Hypertension Maternal Aunt   . Asthma Maternal Aunt   . Diabetes Maternal Aunt   . Colon cancer Neg Hx   . Esophageal cancer Neg Hx   . Stomach cancer Neg Hx   . Rectal cancer Neg Hx   . Breast cancer Neg Hx     Allergies  Allergen Reactions  . Imitrex [Sumatriptan] Anaphylaxis  . Latex Itching  . Penicillins Hives and Swelling    Current Outpatient Medications on File Prior to Visit  Medication Sig Dispense Refill  . Lysine 1000 MG TABS Take by mouth.    . topiramate (TOPAMAX) 50 MG tablet TAKE 1 TABLET BY MOUTH TWICE A DAY 12 tablet 0  . valACYclovir (VALTREX) 1000 MG tablet Take 1,000 mg by mouth daily.      No current facility-administered  medications on file prior to visit.     BP 118/62 (BP Location: Left Arm, Patient Position: Sitting, Cuff Size: Normal)   Pulse 77   Temp 97.8 F (36.6 C) (Oral)   Ht 5' (1.524 m)   Wt 152 lb 12.8 oz (69.3 kg)   SpO2 99%   BMI 29.84 kg/m     Review of Systems  Constitutional: Negative.   HENT: Negative for congestion, dental problem, hearing loss, rhinorrhea, sinus pressure, sore throat and tinnitus.   Eyes: Negative for pain, discharge and visual disturbance.  Respiratory: Negative for cough and shortness of breath.   Cardiovascular: Negative for chest pain, palpitations and leg swelling.  Gastrointestinal: Negative for abdominal distention, abdominal pain, blood in stool, constipation, diarrhea, nausea and vomiting.  Genitourinary: Negative for difficulty urinating, dysuria, flank pain, frequency, hematuria, pelvic pain, urgency, vaginal bleeding, vaginal discharge and vaginal pain.   Musculoskeletal: Negative for arthralgias, gait problem and joint swelling.  Skin: Negative for rash.  Neurological: Positive for headaches. Negative for dizziness, syncope, speech difficulty, weakness and numbness.  Hematological: Negative for adenopathy.  Psychiatric/Behavioral: Negative for agitation, behavioral problems and dysphoric mood. The patient is not nervous/anxious.        Objective:   Physical Exam  Constitutional: She is oriented to person, place, and time. She appears well-developed and well-nourished.  HENT:  Head: Normocephalic.  Right Ear: External ear normal.  Left Ear: External ear normal.  Mouth/Throat: Oropharynx is clear and moist.  Eyes: Conjunctivae and EOM are normal. Pupils are equal, round, and reactive to light.  Neck: Normal range of motion. Neck supple. No thyromegaly present.  Cardiovascular: Normal rate, regular rhythm, normal heart sounds and intact distal pulses.  Pulmonary/Chest: Effort normal and breath sounds normal.  Abdominal: Soft. Bowel sounds are normal. She exhibits no mass. There is no tenderness.  Musculoskeletal: Normal range of motion.  Lymphadenopathy:    She has no cervical adenopathy.  Neurological: She is alert and oriented to person, place, and time.  Skin: Skin is warm and dry. No rash noted.  Psychiatric: She has a normal mood and affect. Her behavior is normal.          Assessment & Plan:   History migraine headaches.  Topamax refilled Flu vaccine encouraged.  Patient will consider  CPX in 1 year  Rogelia BogaKWIATKOWSKI,Yashika Mask FRANK

## 2017-06-24 ENCOUNTER — Other Ambulatory Visit: Payer: Self-pay | Admitting: Internal Medicine

## 2017-09-26 DIAGNOSIS — Z124 Encounter for screening for malignant neoplasm of cervix: Secondary | ICD-10-CM | POA: Diagnosis not present

## 2017-09-26 DIAGNOSIS — Z01419 Encounter for gynecological examination (general) (routine) without abnormal findings: Secondary | ICD-10-CM | POA: Diagnosis not present

## 2017-09-26 DIAGNOSIS — Z6829 Body mass index (BMI) 29.0-29.9, adult: Secondary | ICD-10-CM | POA: Diagnosis not present

## 2017-09-26 DIAGNOSIS — R7303 Prediabetes: Secondary | ICD-10-CM | POA: Diagnosis not present

## 2017-09-26 DIAGNOSIS — Z862 Personal history of diseases of the blood and blood-forming organs and certain disorders involving the immune mechanism: Secondary | ICD-10-CM | POA: Diagnosis not present

## 2017-09-26 DIAGNOSIS — E559 Vitamin D deficiency, unspecified: Secondary | ICD-10-CM | POA: Diagnosis not present

## 2017-10-07 ENCOUNTER — Other Ambulatory Visit: Payer: Self-pay | Admitting: Internal Medicine

## 2017-11-09 DIAGNOSIS — F419 Anxiety disorder, unspecified: Secondary | ICD-10-CM | POA: Diagnosis not present

## 2017-12-06 ENCOUNTER — Inpatient Hospital Stay: Payer: BLUE CROSS/BLUE SHIELD

## 2017-12-06 ENCOUNTER — Inpatient Hospital Stay: Payer: BLUE CROSS/BLUE SHIELD | Attending: Hematology & Oncology | Admitting: Hematology & Oncology

## 2018-01-15 DIAGNOSIS — R319 Hematuria, unspecified: Secondary | ICD-10-CM | POA: Diagnosis not present

## 2018-01-16 ENCOUNTER — Other Ambulatory Visit: Payer: BLUE CROSS/BLUE SHIELD

## 2018-01-16 ENCOUNTER — Ambulatory Visit: Payer: BLUE CROSS/BLUE SHIELD | Admitting: Family

## 2018-01-24 ENCOUNTER — Other Ambulatory Visit: Payer: Self-pay | Admitting: Family

## 2018-01-24 DIAGNOSIS — D649 Anemia, unspecified: Secondary | ICD-10-CM

## 2018-01-27 ENCOUNTER — Other Ambulatory Visit: Payer: BLUE CROSS/BLUE SHIELD

## 2018-01-27 ENCOUNTER — Ambulatory Visit: Payer: BLUE CROSS/BLUE SHIELD | Admitting: Family

## 2018-02-20 ENCOUNTER — Other Ambulatory Visit: Payer: BLUE CROSS/BLUE SHIELD

## 2018-02-20 ENCOUNTER — Ambulatory Visit: Payer: BLUE CROSS/BLUE SHIELD | Admitting: Family

## 2018-02-25 ENCOUNTER — Other Ambulatory Visit: Payer: Self-pay

## 2018-02-25 ENCOUNTER — Emergency Department (HOSPITAL_BASED_OUTPATIENT_CLINIC_OR_DEPARTMENT_OTHER)
Admission: EM | Admit: 2018-02-25 | Discharge: 2018-02-25 | Disposition: A | Discharge status: Home / Self Care | Payer: BLUE CROSS/BLUE SHIELD | Attending: Emergency Medicine | Admitting: Emergency Medicine

## 2018-02-25 ENCOUNTER — Encounter (HOSPITAL_BASED_OUTPATIENT_CLINIC_OR_DEPARTMENT_OTHER): Payer: Self-pay

## 2018-02-25 DIAGNOSIS — Z79899 Other long term (current) drug therapy: Secondary | ICD-10-CM | POA: Diagnosis not present

## 2018-02-25 DIAGNOSIS — R51 Headache: Secondary | ICD-10-CM | POA: Diagnosis not present

## 2018-02-25 DIAGNOSIS — R519 Headache, unspecified: Secondary | ICD-10-CM

## 2018-02-25 MED ORDER — SODIUM CHLORIDE 0.9 % IV BOLUS
1000.0000 mL | Freq: Once | INTRAVENOUS | Status: AC
  Administered 2018-02-25: 1000 mL via INTRAVENOUS

## 2018-02-25 MED ORDER — DIPHENHYDRAMINE HCL 50 MG/ML IJ SOLN
25.0000 mg | Freq: Once | INTRAMUSCULAR | Status: AC
  Administered 2018-02-25: 25 mg via INTRAVENOUS
  Filled 2018-02-25: qty 1

## 2018-02-25 MED ORDER — DEXAMETHASONE SODIUM PHOSPHATE 10 MG/ML IJ SOLN
10.0000 mg | Freq: Once | INTRAMUSCULAR | Status: AC
  Administered 2018-02-25: 10 mg via INTRAVENOUS
  Filled 2018-02-25: qty 1

## 2018-02-25 MED ORDER — KETOROLAC TROMETHAMINE 30 MG/ML IJ SOLN
30.0000 mg | Freq: Once | INTRAMUSCULAR | Status: AC
  Administered 2018-02-25: 30 mg via INTRAVENOUS
  Filled 2018-02-25: qty 1

## 2018-02-25 MED ORDER — PROCHLORPERAZINE EDISYLATE 10 MG/2ML IJ SOLN
10.0000 mg | Freq: Once | INTRAMUSCULAR | Status: AC
  Administered 2018-02-25: 10 mg via INTRAVENOUS
  Filled 2018-02-25: qty 2

## 2018-02-25 NOTE — ED Notes (Signed)
C/o ha x 3 days w nausea

## 2018-02-25 NOTE — ED Triage Notes (Signed)
C/o migraine x 3 days-NAD-steady gait 

## 2018-02-25 NOTE — ED Provider Notes (Signed)
Signout from previous provider, Ebbie Ridgehris Lawyer, PA-C at shift change See previous providers note for full H&P  Briefly, patient with history of migraines, presents with headache.  Patient given headache cocktail and her pain has decreased from 10/10 to 8/10, however patient is sleeping.  Plan to reassess and give pain medication if not much improved.  No focal deficits with normal neuro exam, per PA Lawyer.  9:27 PM On reassessment, patient is feeling much better and is ready to go home.  Patient will be discharged home with her husband, as she drove here and received sedating medications.  Patient given return precautions.  Patient advised to follow-up with her PCP regarding today's visit.  Patient understands and agrees with plan.  Patient vitals stable throughout ED course and discharged in satisfactory condition.   Emi HolesLaw, Bonnye Halle M, PA-C 02/25/18 2128    Linwood DibblesKnapp, Jon, MD 02/26/18 1415

## 2018-02-25 NOTE — Discharge Instructions (Addendum)
Please follow-up with your doctor and let them know about your headache requiring hospital visit today.  Please return to emergency department if you develop any new or worsening symptoms.

## 2018-02-25 NOTE — ED Provider Notes (Signed)
[facility] EMERGENCY DEPARTMENT Provider Note   CSN: [id number] Arrival date & time: 02/25/18  1633     History   Chief Complaint Chief Complaint  Patient presents with  . Migraine    HPI [name] is a 57 y.o. female.  HPI Patient presents to the emergency department with migraine headache.  The patient states this started 3 days ago.  The patient states that she has no other complaints.  She states she takes Topamax for her headaches.  Patient states that she is been under a lot of stress lately and she feels like this is contributing to the headache.  The patient denies chest pain, shortness of breath,blurred vision, neck pain, fever, cough, weakness, numbness, dizziness, anorexia, edema, abdominal pain, nausea, vomiting, diarrhea, rash, back pain, dysuria, hematemesis, bloody stool, near syncope, or syncope. Past Medical History:  Diagnosis Date  . Abnormal Pap smear 2001  . AMA (advanced maternal age) multigravida 35+   . ANEMIA 03/23/2010  . Dyspareunia 2008  . Female infertility   . GALLSTONES 03/23/2010  . H/O constipation 2001  . H/O mumps   . H/O varicella    As a child   . Headache(784.0) 12/18/2006   Frequently  . Heart murmur   . History of rubella    As a child   . Hx: UTI (urinary tract infection) 04/2003  . Migraine   . Mild mitral valve prolapse   . NONSPECIFIC ABNORM FIND RAD&OTH EXAM BILI TRACT 02/02/2008  . OVARIAN CYST 03/23/2010  . Ulcer 2001    Patient Active Problem List   Diagnosis Date Noted  . ANEMIA 03/23/2010  . GALLSTONES 03/23/2010  . OVARIAN CYST 03/23/2010  . NONSPECIFIC ABNORM FIND RAD&OTH EXAM BILI TRACT 02/02/2008  . History of migraine headaches 12/18/2006    Past Surgical History:  Procedure Laterality Date  . CESAREAN SECTION     x 2  . LAPAROSCOPIC ABLATION RENAL MASS    . LAPAROSCOPIC OVARIAN CYSTECTOMY    . WISDOM TOOTH EXTRACTION       OB History    Gravida  2   Para  2   Term  2   Preterm     AB      Living  2     SAB      TAB      Ectopic      Multiple      Live Births  2            Home Medications    Prior to Admission medications   Medication Sig Start Date End Date Taking? Authorizing Provider  Lysine 1000 MG TABS Take by mouth.    [provider]  topiramate (TOPAMAX) 50 MG tablet Take 1 tablet (50 mg total) by mouth 2 (two) times daily. 10/09/17   Gordy Savers, MD  valACYclovir (VALTREX) 1000 MG tablet Take 1,000 mg by mouth daily.  11/15/13   [provider]    Family History Family History  Problem Relation Age of Onset  . Hypertension Mother   . Heart disease Father   . Hypertension Maternal Aunt   . Asthma Maternal Aunt   . Diabetes Maternal Aunt   . Colon cancer Neg Hx   . Esophageal cancer Neg Hx   . Stomach cancer Neg Hx   . Rectal cancer Neg Hx   . Breast cancer Neg Hx     Social History Social History   Tobacco Use  . Smoking status:  Never Smoker  . Smokeless tobacco: Never Used  Substance Use Topics  . Alcohol use: No    Alcohol/week: 0.0 standard drinks  . Drug use: No     Allergies   Imitrex [sumatriptan]; Latex; and Penicillins   Review of Systems Review of Systems All other systems negative except as documented in the HPI. All pertinent positives and negatives as reviewed in the HPI.  Physical Exam Updated Vital Signs BP 118/70 (BP Location: Left Arm)   Pulse (!) 58   Temp 98.3 F (36.8 C) (Oral)   Resp 18   Ht 5' (1.524 m)   Wt 69.4 kg   LMP 06/11/2014   SpO2 100%   BMI 29.88 kg/m   Physical Exam  Constitutional: She is oriented to person, place, and time. She appears well-developed and well-nourished. No distress.  HENT:  Head: Normocephalic and atraumatic.  Mouth/Throat: Oropharynx is clear and moist.  Eyes: Pupils are equal, round, and reactive to light.  Neck: Normal range of motion. Neck supple.  Cardiovascular: Normal rate, regular rhythm and normal heart sounds.  Exam reveals no gallop and no friction rub.  No murmur heard. Pulmonary/Chest: Effort normal and breath sounds normal. No respiratory distress. She has no wheezes.  Neurological: She is alert and oriented to person, place, and time. She displays normal reflexes. No sensory deficit. She exhibits normal muscle tone. Coordination normal.  Skin: Skin is warm and dry. Capillary refill takes less than 2 seconds. No rash noted. No erythema.  Psychiatric: She has a normal mood and affect. Her behavior is normal.  Nursing note and vitals reviewed.    ED Treatments / Results  Labs (all labs ordered are listed, but only abnormal results are displayed) Labs Reviewed - No data to display  EKG None  Radiology No results found.  Procedures Procedures (including critical care time)  Medications Ordered in ED Medications  sodium chloride 0.9 % bolus 1,000 mL (1,000 mLs Intravenous New Bag/Given 02/25/18 1808)  ketorolac (TORADOL) 30 MG/ML injection 30 mg (30 mg Intravenous Given 02/25/18 1819)  prochlorperazine (COMPAZINE) injection 10 mg (10 mg Intravenous Given 02/25/18 1811)  diphenhydrAMINE (BENADRYL) injection 25 mg (25 mg Intravenous Given 02/25/18 1816)     Initial Impression / Assessment and Plan / ED Course  I have reviewed the triage vital signs and the nursing notes.  Pertinent labs & imaging results that were available during my care of the patient were reviewed by me and considered in my medical decision making (see chart for details).     Patient is very drowsy on my reassessment and sleeping.  Patient has no neurological deficits noted on exam.  She does not have any focal deficits on exam.  I do not feel that there is a significant intracranial abnormality causing this headache other than her typical migraines even though she states that this seems worse due to the increased stress. Final Clinical Impressions(s) / ED Diagnoses   Final diagnoses:  None    ED Discharge Orders     None       Kyra MangesLawyer, Merrell Rettinger, PA-C 02/25/18 Alexia Freestone1935    Knapp, Jon, MD 02/26/18 1415

## 2018-02-26 ENCOUNTER — Other Ambulatory Visit: Payer: BLUE CROSS/BLUE SHIELD

## 2018-02-26 ENCOUNTER — Ambulatory Visit: Payer: BLUE CROSS/BLUE SHIELD | Admitting: Family

## 2018-03-12 DIAGNOSIS — Z049 Encounter for examination and observation for unspecified reason: Secondary | ICD-10-CM | POA: Diagnosis not present

## 2018-03-12 DIAGNOSIS — G43701 Chronic migraine without aura, not intractable, with status migrainosus: Secondary | ICD-10-CM | POA: Diagnosis not present

## 2018-03-13 DIAGNOSIS — G43701 Chronic migraine without aura, not intractable, with status migrainosus: Secondary | ICD-10-CM | POA: Diagnosis not present

## 2018-03-13 DIAGNOSIS — M791 Myalgia, unspecified site: Secondary | ICD-10-CM | POA: Diagnosis not present

## 2018-03-13 DIAGNOSIS — M542 Cervicalgia: Secondary | ICD-10-CM | POA: Diagnosis not present

## 2018-03-13 DIAGNOSIS — G518 Other disorders of facial nerve: Secondary | ICD-10-CM | POA: Diagnosis not present

## 2018-03-19 ENCOUNTER — Ambulatory Visit: Payer: BLUE CROSS/BLUE SHIELD | Admitting: Family

## 2018-03-19 ENCOUNTER — Other Ambulatory Visit: Payer: BLUE CROSS/BLUE SHIELD

## 2018-03-27 DIAGNOSIS — M791 Myalgia, unspecified site: Secondary | ICD-10-CM | POA: Diagnosis not present

## 2018-03-27 DIAGNOSIS — M542 Cervicalgia: Secondary | ICD-10-CM | POA: Diagnosis not present

## 2018-03-27 DIAGNOSIS — G43701 Chronic migraine without aura, not intractable, with status migrainosus: Secondary | ICD-10-CM | POA: Diagnosis not present

## 2018-03-27 DIAGNOSIS — G518 Other disorders of facial nerve: Secondary | ICD-10-CM | POA: Diagnosis not present

## 2018-04-07 ENCOUNTER — Other Ambulatory Visit: Payer: Self-pay | Admitting: Obstetrics and Gynecology

## 2018-04-07 DIAGNOSIS — Z1231 Encounter for screening mammogram for malignant neoplasm of breast: Secondary | ICD-10-CM

## 2018-04-09 ENCOUNTER — Inpatient Hospital Stay: Payer: BLUE CROSS/BLUE SHIELD | Admitting: Family

## 2018-04-09 ENCOUNTER — Inpatient Hospital Stay: Payer: BLUE CROSS/BLUE SHIELD | Attending: Hematology & Oncology

## 2018-04-10 ENCOUNTER — Other Ambulatory Visit: Payer: Self-pay | Admitting: Specialist

## 2018-04-10 DIAGNOSIS — G43701 Chronic migraine without aura, not intractable, with status migrainosus: Secondary | ICD-10-CM | POA: Diagnosis not present

## 2018-04-10 DIAGNOSIS — M791 Myalgia, unspecified site: Secondary | ICD-10-CM | POA: Diagnosis not present

## 2018-04-10 DIAGNOSIS — G518 Other disorders of facial nerve: Secondary | ICD-10-CM | POA: Diagnosis not present

## 2018-04-10 DIAGNOSIS — R51 Headache: Principal | ICD-10-CM

## 2018-04-10 DIAGNOSIS — M542 Cervicalgia: Secondary | ICD-10-CM | POA: Diagnosis not present

## 2018-04-10 DIAGNOSIS — R519 Headache, unspecified: Secondary | ICD-10-CM

## 2018-04-10 DIAGNOSIS — G43809 Other migraine, not intractable, without status migrainosus: Secondary | ICD-10-CM

## 2018-04-21 ENCOUNTER — Other Ambulatory Visit: Payer: BLUE CROSS/BLUE SHIELD

## 2018-04-24 DIAGNOSIS — M791 Myalgia, unspecified site: Secondary | ICD-10-CM | POA: Diagnosis not present

## 2018-04-24 DIAGNOSIS — M542 Cervicalgia: Secondary | ICD-10-CM | POA: Diagnosis not present

## 2018-04-24 DIAGNOSIS — G43701 Chronic migraine without aura, not intractable, with status migrainosus: Secondary | ICD-10-CM | POA: Diagnosis not present

## 2018-04-24 DIAGNOSIS — G518 Other disorders of facial nerve: Secondary | ICD-10-CM | POA: Diagnosis not present

## 2018-04-29 ENCOUNTER — Ambulatory Visit
Admission: RE | Admit: 2018-04-29 | Discharge: 2018-04-29 | Disposition: A | Discharge status: Home / Self Care | Payer: BLUE CROSS/BLUE SHIELD | Source: Ambulatory Visit | Attending: Specialist | Admitting: Specialist

## 2018-04-29 DIAGNOSIS — G43809 Other migraine, not intractable, without status migrainosus: Secondary | ICD-10-CM

## 2018-04-29 DIAGNOSIS — R51 Headache: Principal | ICD-10-CM

## 2018-04-29 DIAGNOSIS — R519 Headache, unspecified: Secondary | ICD-10-CM

## 2018-05-10 ENCOUNTER — Other Ambulatory Visit: Payer: Self-pay | Admitting: Internal Medicine

## 2018-05-15 DIAGNOSIS — M542 Cervicalgia: Secondary | ICD-10-CM | POA: Diagnosis not present

## 2018-05-15 DIAGNOSIS — G43701 Chronic migraine without aura, not intractable, with status migrainosus: Secondary | ICD-10-CM | POA: Diagnosis not present

## 2018-05-15 DIAGNOSIS — M791 Myalgia, unspecified site: Secondary | ICD-10-CM | POA: Diagnosis not present

## 2018-05-15 DIAGNOSIS — G518 Other disorders of facial nerve: Secondary | ICD-10-CM | POA: Diagnosis not present

## 2018-05-16 ENCOUNTER — Ambulatory Visit
Admission: RE | Admit: 2018-05-16 | Discharge: 2018-05-16 | Disposition: A | Discharge status: Home / Self Care | Payer: BLUE CROSS/BLUE SHIELD | Source: Ambulatory Visit | Attending: Obstetrics and Gynecology | Admitting: Obstetrics and Gynecology

## 2018-05-16 DIAGNOSIS — Z1231 Encounter for screening mammogram for malignant neoplasm of breast: Secondary | ICD-10-CM | POA: Diagnosis not present

## 2018-05-28 DIAGNOSIS — G518 Other disorders of facial nerve: Secondary | ICD-10-CM | POA: Diagnosis not present

## 2018-05-28 DIAGNOSIS — G43701 Chronic migraine without aura, not intractable, with status migrainosus: Secondary | ICD-10-CM | POA: Diagnosis not present

## 2018-05-28 DIAGNOSIS — M791 Myalgia, unspecified site: Secondary | ICD-10-CM | POA: Diagnosis not present

## 2018-05-28 DIAGNOSIS — M542 Cervicalgia: Secondary | ICD-10-CM | POA: Diagnosis not present

## 2018-08-21 DIAGNOSIS — G43701 Chronic migraine without aura, not intractable, with status migrainosus: Secondary | ICD-10-CM | POA: Diagnosis not present

## 2018-10-22 ENCOUNTER — Ambulatory Visit (INDEPENDENT_AMBULATORY_CARE_PROVIDER_SITE_OTHER): Payer: BLUE CROSS/BLUE SHIELD | Admitting: Family Medicine

## 2018-10-22 ENCOUNTER — Ambulatory Visit: Payer: Self-pay

## 2018-10-22 ENCOUNTER — Other Ambulatory Visit: Payer: Self-pay

## 2018-10-22 ENCOUNTER — Encounter: Payer: Self-pay | Admitting: Family Medicine

## 2018-10-22 DIAGNOSIS — H00012 Hordeolum externum right lower eyelid: Secondary | ICD-10-CM

## 2018-10-22 NOTE — Telephone Encounter (Signed)
Returned call to patient who states that she has a stye right lower lid.  She has tried over the counter medication which she states has irritated the area and caused her entire lower lid to swell. She states she stopped that medication and used witch hasell. She states that today the swelling has improved. The area remains slightly red and she sees two small white dots now on the lower lid.  She state the pain is mils now. Care advice read to patient. Patient verbalized understanding. Call transferred to [facility] at office for appointment.  Reason for Disposition . [1] After 5 days of treatment per [facility] Advice AND [2] not better  Answer Assessment - Initial Assessment Questions 1. LOCATION: "Which eye has the sty?" "Upper or lower eyelid?"     Rt lower eye lid 2. SIZE: "How big is it?" (Note: standard pencil eraser is 6 mm)    Very swollen lower lid selling in her face 3. EYELID: "Is the eyelid swollen?" If so, ask: "How much?"     yes 4. REDNESS: "Has the redness spread onto the eyelid?"    none 5. ONSET: "When did you notice the sty?"    Saturday 6. VISION: "Do you have blurred vision?"     no 7. PAIN: "Is it painful?" If so, ask: "How bad is the pain?"  (Scale 1-10; or mild, moderate, severe)     1 now 8. CONTACTS: "Do you wear contacts?"     yes 9. OTHER SYMPTOMS: "Do you have any other symptoms?" (e.g., fever)     fever 10. PREGNANCY: "Is there any chance you are pregnant?" "When was your last menstrual period?"      N/A  Protocols used: STY-A-AH

## 2018-10-22 NOTE — Progress Notes (Signed)
Virtual Visit via Video Note  I connected with [name]  (pronouned Ton-ya) on 10/22/18 at  3:30 PM EDT by a video enabled telemedicine application and verified that I am speaking with the correct person using two identifiers.  Location patient: home Location provider:work or home office Persons participating in the virtual visit: patient, provider  I discussed the limitations of evaluation and management by telemedicine and the availability of in person appointments. The patient expressed understanding and agreed to proceed.   HPI: Felt a bump on lower eye lid Friday with some irritation.  Used "sty" medicine, Systane nighttime eye lubricant, from [facility] on Saturday.  Pt notes the area became worse so she stopped the med after 2 days of use.  The bump was painful, now uncomfortable when pt blinks.  The redness and swelling are improving.  Pt wearing glasses instead of her disposable contacts.  Pt states she sees better with her contacts as her glasses have an old rx.  Pt denies use of old make up.  States recently bought new mascara since COVID-19 pandemic has been going on.   ROS: See pertinent positives and negatives per HPI.  Past Medical History:  Diagnosis Date  . Abnormal Pap smear 2001  . AMA (advanced maternal age) multigravida 35+   . ANEMIA 03/23/2010  . Dyspareunia 2008  . Female infertility   . GALLSTONES 03/23/2010  . H/O constipation 2001  . H/O mumps   . H/O varicella    As a child   . Headache(784.0) 12/18/2006   Frequently  . Heart murmur   . History of rubella    As a child   . Hx: UTI (urinary tract infection) 04/2003  . Migraine   . Mild mitral valve prolapse   . NONSPECIFIC ABNORM FIND RAD&OTH EXAM BILI TRACT 02/02/2008  . OVARIAN CYST 03/23/2010  . Ulcer 2001    Past Surgical History:  Procedure Laterality Date  . CESAREAN SECTION     x 2  . LAPAROSCOPIC ABLATION RENAL MASS    . LAPAROSCOPIC OVARIAN CYSTECTOMY    . WISDOM TOOTH EXTRACTION       Family History  Problem Relation Age of Onset  . Hypertension Mother   . Heart disease Father   . Hypertension Maternal Aunt   . Asthma Maternal Aunt   . Diabetes Maternal Aunt   . Colon cancer Neg Hx   . Esophageal cancer Neg Hx   . Stomach cancer Neg Hx   . Rectal cancer Neg Hx   . Breast cancer Neg Hx     SOCIAL HX:    Current Outpatient Medications:  .  Lysine 1000 MG TABS, Take by mouth., Disp: , Rfl:  .  topiramate (TOPAMAX) 50 MG tablet, Take 1 tablet (50 mg total) by mouth 2 (two) times daily., Disp: 60 tablet, Rfl: 5 .  valACYclovir (VALTREX) 1000 MG tablet, Take 1,000 mg by mouth daily. , Disp: , Rfl:   EXAM:  VITALS per patient if applicable:  RR between 12-20 bpm  GENERAL: alert, oriented, appears well and in no acute distress  HEENT: atraumatic, conjunctiva clear, no obvious abnormalities on inspection of external nose and ears  NECK: normal movements of the head and neck  LUNGS: on inspection no signs of respiratory distress, breathing rate appears normal, no obvious gross SOB, gasping or wheezing  CV: no obvious cyanosis  MS: moves all visible extremities without noticeable abnormality  PSYCH/NEURO: pleasant and cooperative, no obvious depression or anxiety, speech and  thought processing grossly intact  ASSESSMENT AND PLAN:  Discussed the following assessment and plan:  Hordeolum externum of right lower eyelid -improving -discussed using warm compresses QID -if symptoms do not improve in 1 wk, pt to notify clinic as may require referral to Ophthalmology for drainage. -encouraged to wear glasses instead of contacts if able until area heals -f/u prn  I discussed the assessment and treatment plan with the patient. The patient was provided an opportunity to ask questions and all were answered. The patient agreed with the plan and demonstrated an understanding of the instructions.   The patient was advised to call back or seek an in-person  evaluation if the symptoms worsen or if the condition fails to improve as anticipated.   Deeann SaintShannon R Addie Cederberg, MD

## 2018-10-22 NOTE — Telephone Encounter (Signed)
Pt is scheduled for a virtual visit with Dr Banks this afternoon 

## 2018-12-18 ENCOUNTER — Other Ambulatory Visit: Payer: Self-pay

## 2018-12-18 ENCOUNTER — Ambulatory Visit: Payer: BC Managed Care – PPO | Admitting: Family Medicine

## 2018-12-18 ENCOUNTER — Encounter: Payer: BLUE CROSS/BLUE SHIELD | Admitting: Family Medicine

## 2018-12-25 ENCOUNTER — Ambulatory Visit (INDEPENDENT_AMBULATORY_CARE_PROVIDER_SITE_OTHER): Payer: BC Managed Care – PPO | Admitting: Family Medicine

## 2018-12-25 ENCOUNTER — Other Ambulatory Visit: Payer: Self-pay

## 2018-12-25 VITALS — BP 110/70 | Ht 60.0 in | Wt 150.0 lb

## 2018-12-25 DIAGNOSIS — M7541 Impingement syndrome of right shoulder: Secondary | ICD-10-CM

## 2018-12-25 MED ORDER — DICLOFENAC SODIUM 75 MG PO TBEC: 75.0000 mg | DELAYED_RELEASE_TABLET | Freq: Two times a day (BID) | ORAL | 1 refills | Status: DC

## 2018-12-25 NOTE — Assessment & Plan Note (Signed)
Diclofenac 75mg  BID, tylenol PRN Referral for physical therapy Discussed subacromial injection, nitro patches if pain does not improve Follow up in 6 weeks

## 2018-12-25 NOTE — Patient Instructions (Signed)
You have rotator cuff impingement. Try to avoid painful activities (overhead activities, lifting with extended arm) as much as possible. Diclofenac 75mg  twice a day with food for pain and inflammation - don't take aleve or ibuprofen with this. Can take tylenol in addition to this. Subacromial injection may be beneficial to help with pain and to decrease inflammation. Start physical therapy with transition to home exercise program. Do home exercise program with theraband and scapular stabilization exercises daily 3 sets of 10 once a day. If not improving at follow-up we will consider imaging, injection, and/or nitro patches. Follow up with me in 6 weeks.
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
## 2018-12-30 ENCOUNTER — Encounter: Payer: Self-pay | Admitting: Family Medicine

## 2019-01-01 DIAGNOSIS — M25611 Stiffness of right shoulder, not elsewhere classified: Secondary | ICD-10-CM | POA: Diagnosis not present

## 2019-01-01 DIAGNOSIS — R293 Abnormal posture: Secondary | ICD-10-CM | POA: Diagnosis not present

## 2019-01-01 DIAGNOSIS — M629 Disorder of muscle, unspecified: Secondary | ICD-10-CM | POA: Diagnosis not present

## 2019-01-01 DIAGNOSIS — M25511 Pain in right shoulder: Secondary | ICD-10-CM | POA: Diagnosis not present

## 2019-01-06 DIAGNOSIS — M25511 Pain in right shoulder: Secondary | ICD-10-CM | POA: Diagnosis not present

## 2019-01-06 DIAGNOSIS — M25611 Stiffness of right shoulder, not elsewhere classified: Secondary | ICD-10-CM | POA: Diagnosis not present

## 2019-01-06 DIAGNOSIS — R293 Abnormal posture: Secondary | ICD-10-CM | POA: Diagnosis not present

## 2019-01-06 DIAGNOSIS — M629 Disorder of muscle, unspecified: Secondary | ICD-10-CM | POA: Diagnosis not present

## 2019-01-07 ENCOUNTER — Other Ambulatory Visit: Payer: Self-pay

## 2019-01-07 ENCOUNTER — Encounter (HOSPITAL_BASED_OUTPATIENT_CLINIC_OR_DEPARTMENT_OTHER): Payer: Self-pay

## 2019-01-07 ENCOUNTER — Emergency Department (HOSPITAL_BASED_OUTPATIENT_CLINIC_OR_DEPARTMENT_OTHER)
Admission: EM | Admit: 2019-01-07 | Discharge: 2019-01-08 | Disposition: A | Discharge status: Home / Self Care | Payer: BC Managed Care – PPO | Attending: Emergency Medicine | Admitting: Emergency Medicine

## 2019-01-07 DIAGNOSIS — Y999 Unspecified external cause status: Secondary | ICD-10-CM | POA: Insufficient documentation

## 2019-01-07 DIAGNOSIS — Z79899 Other long term (current) drug therapy: Secondary | ICD-10-CM | POA: Diagnosis not present

## 2019-01-07 DIAGNOSIS — Y9289 Other specified places as the place of occurrence of the external cause: Secondary | ICD-10-CM | POA: Diagnosis not present

## 2019-01-07 DIAGNOSIS — S199XXA Unspecified injury of neck, initial encounter: Secondary | ICD-10-CM | POA: Diagnosis present

## 2019-01-07 DIAGNOSIS — Y9389 Activity, other specified: Secondary | ICD-10-CM | POA: Diagnosis not present

## 2019-01-07 DIAGNOSIS — M25511 Pain in right shoulder: Secondary | ICD-10-CM | POA: Insufficient documentation

## 2019-01-07 DIAGNOSIS — G8929 Other chronic pain: Secondary | ICD-10-CM | POA: Diagnosis not present

## 2019-01-07 DIAGNOSIS — S161XXA Strain of muscle, fascia and tendon at neck level, initial encounter: Secondary | ICD-10-CM | POA: Diagnosis not present

## 2019-01-07 DIAGNOSIS — M79601 Pain in right arm: Secondary | ICD-10-CM | POA: Diagnosis not present

## 2019-01-07 DIAGNOSIS — M542 Cervicalgia: Secondary | ICD-10-CM | POA: Diagnosis not present
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
## 2019-01-08 ENCOUNTER — Emergency Department (HOSPITAL_BASED_OUTPATIENT_CLINIC_OR_DEPARTMENT_OTHER): Payer: BC Managed Care – PPO

## 2019-01-08 DIAGNOSIS — M542 Cervicalgia: Secondary | ICD-10-CM | POA: Diagnosis not present

## 2019-01-08 DIAGNOSIS — M79601 Pain in right arm: Secondary | ICD-10-CM | POA: Diagnosis not present

## 2019-01-08 MED ORDER — CYCLOBENZAPRINE HCL 10 MG PO TABS: 10.0000 mg | ORAL_TABLET | Freq: Three times a day (TID) | ORAL | 0 refills | Status: DC | PRN

## 2019-01-08 MED ORDER — IBUPROFEN 800 MG PO TABS
800.0000 mg | ORAL_TABLET | Freq: Once | ORAL | Status: AC
  Administered 2019-01-08: 800 mg via ORAL

## 2019-01-08 MED ORDER — IBUPROFEN 800 MG PO TABS
ORAL_TABLET | ORAL | Status: AC
  Administered 2019-01-08: 03:00:00 800 mg via ORAL
  Filled 2019-01-08: qty 1
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
## 2019-02-04 DIAGNOSIS — G43701 Chronic migraine without aura, not intractable, with status migrainosus: Secondary | ICD-10-CM | POA: Diagnosis not present

## 2019-02-17 ENCOUNTER — Other Ambulatory Visit: Payer: Self-pay | Admitting: *Deleted

## 2019-02-25 ENCOUNTER — Other Ambulatory Visit: Payer: Self-pay | Admitting: *Deleted

## 2019-02-25 MED ORDER — DICLOFENAC SODIUM 75 MG PO TBEC: 75.0000 mg | DELAYED_RELEASE_TABLET | Freq: Two times a day (BID) | ORAL | 1 refills | Status: DC

## 2019-04-27 ENCOUNTER — Other Ambulatory Visit: Payer: Self-pay

## 2019-04-27 ENCOUNTER — Telehealth (INDEPENDENT_AMBULATORY_CARE_PROVIDER_SITE_OTHER): Payer: BC Managed Care – PPO | Admitting: Family Medicine

## 2019-04-27 DIAGNOSIS — H00011 Hordeolum externum right upper eyelid: Secondary | ICD-10-CM | POA: Diagnosis not present

## 2019-04-27 MED ORDER — ERYTHROMYCIN 5 MG/GM OP OINT: 1.0000 "application " | TOPICAL_OINTMENT | Freq: Two times a day (BID) | OPHTHALMIC | 0 refills | Status: AC | PRN

## 2019-04-27 NOTE — Progress Notes (Signed)
Virtual Visit via Video Note  I connected with [name] on 04/27/19 at 10:00 AM EST by a video enabled telemedicine application 2/2 [id number] pandemic and verified that I am speaking with the correct person using two identifiers.  Location patient: home Location provider:work or home office Persons participating in the virtual visit: patient, provider  I discussed the limitations of evaluation and management by telemedicine and the availability of in person appointments. The patient expressed understanding and agreed to proceed.   HPI: Pt is a 58 yo female previously seen by Dr. Raliegh Ip.  Pt seen today for acute concern.  R eye with stye since Friday.  Tried warm compress, Stye ointment, and tea bags.  Area is painful.  Pt notes the bump started draining causing eye to have dried drainage matted to the lashes.  Pt denies scleral erythema,  conjunctival injection, periorbital edema or TTP, fever, chills, changes in vision.  ROS: See pertinent positives and negatives per HPI.  Past Medical History:  Diagnosis Date  . Abnormal Pap smear 2001  . AMA (advanced maternal age) multigravida 62+   . ANEMIA 03/23/2010  . Dyspareunia 2008  . Female infertility   . GALLSTONES 03/23/2010  . H/O constipation 2001  . H/O mumps   . H/O varicella    As a child   . Headache(784.0) 12/18/2006   Frequently  . Heart murmur   . History of rubella    As a child   . Hx: UTI (urinary tract infection) 04/2003  . Migraine   . Mild mitral valve prolapse   . NONSPECIFIC ABNORM FIND RAD&OTH EXAM BILI TRACT 02/02/2008  . OVARIAN CYST 03/23/2010  . Ulcer 2001    Past Surgical History:  Procedure Laterality Date  . CESAREAN SECTION     x 2  . LAPAROSCOPIC ABLATION RENAL MASS    . LAPAROSCOPIC OVARIAN CYSTECTOMY    . WISDOM TOOTH EXTRACTION      Family History  Problem Relation Age of Onset  . Hypertension Mother   . Heart disease Father   . Hypertension Maternal Aunt   . Asthma Maternal Aunt   .  Diabetes Maternal Aunt   . Colon cancer Neg Hx   . Esophageal cancer Neg Hx   . Stomach cancer Neg Hx   . Rectal cancer Neg Hx   . Breast cancer Neg Hx      Current Outpatient Medications:  .  cyclobenzaprine (FLEXERIL) 10 MG tablet, Take 1 tablet (10 mg total) by mouth 3 (three) times daily as needed for muscle spasms., Disp: 20 tablet, Rfl: 0 .  diclofenac (VOLTAREN) 75 MG EC tablet, Take 1 tablet (75 mg total) by mouth 2 (two) times daily., Disp: 60 tablet, Rfl: 1 .  Lysine 1000 MG TABS, Take by mouth., Disp: , Rfl:  .  topiramate (TOPAMAX) 50 MG tablet, Take 1 tablet (50 mg total) by mouth 2 (two) times daily., Disp: 60 tablet, Rfl: 5  EXAM:  VITALS per patient if applicable:  RR between 12-20 bpm  GENERAL: alert, oriented, appears well and in no acute distress  HEENT: atraumatic, conjunctiva clear, R upper eyelid with erythema and mild to moderate edema.  no obvious abnormalities on inspection of external nose and ears  NECK: normal movements of the head and neck  LUNGS: on inspection no signs of respiratory distress, breathing rate appears normal, no obvious gross SOB, gasping or wheezing  CV: no obvious cyanosis  MS: moves all visible extremities without noticeable abnormality  PSYCH/NEURO:  pleasant and cooperative, no obvious depression or anxiety, speech and thought processing grossly intact  ASSESSMENT AND PLAN:  Discussed the following assessment and plan:  Hordeolum externum of right upper eyelid  -warm compresses QID -avoid wearing eye make up or contact lenses -given precautions for worsening symptoms. -consider Ophthalmology referral for continued symptoms -allergy to PCN - Plan: erythromycin ophthalmic ointment  F/u prn   I discussed the assessment and treatment plan with the patient. The patient was provided an opportunity to ask questions and all were answered. The patient agreed with the plan and demonstrated an understanding of the instructions.    The patient was advised to call back or seek an in-person evaluation if the symptoms worsen or if the condition fails to improve as anticipated.   Deeann Saint, MD

## 2019-05-15 ENCOUNTER — Other Ambulatory Visit: Payer: Self-pay | Admitting: Obstetrics and Gynecology

## 2019-05-15 DIAGNOSIS — B009 Herpesviral infection, unspecified: Secondary | ICD-10-CM | POA: Diagnosis not present

## 2019-05-15 DIAGNOSIS — Z124 Encounter for screening for malignant neoplasm of cervix: Secondary | ICD-10-CM | POA: Diagnosis not present

## 2019-05-15 DIAGNOSIS — Z01419 Encounter for gynecological examination (general) (routine) without abnormal findings: Secondary | ICD-10-CM | POA: Diagnosis not present

## 2019-05-15 DIAGNOSIS — Z1231 Encounter for screening mammogram for malignant neoplasm of breast: Secondary | ICD-10-CM

## 2019-05-15 DIAGNOSIS — Z6828 Body mass index (BMI) 28.0-28.9, adult: Secondary | ICD-10-CM | POA: Diagnosis not present

## 2019-05-19 ENCOUNTER — Other Ambulatory Visit: Payer: Self-pay

## 2019-05-19 ENCOUNTER — Ambulatory Visit
Admission: RE | Admit: 2019-05-19 | Discharge: 2019-05-19 | Disposition: A | Discharge status: Home / Self Care | Payer: BC Managed Care – PPO | Source: Ambulatory Visit | Attending: Obstetrics and Gynecology | Admitting: Obstetrics and Gynecology

## 2019-05-19 DIAGNOSIS — Z1231 Encounter for screening mammogram for malignant neoplasm of breast: Secondary | ICD-10-CM | POA: Diagnosis not present

## 2020-01-08 ENCOUNTER — Encounter (HOSPITAL_BASED_OUTPATIENT_CLINIC_OR_DEPARTMENT_OTHER): Payer: Self-pay | Admitting: *Deleted

## 2020-01-08 ENCOUNTER — Emergency Department (HOSPITAL_BASED_OUTPATIENT_CLINIC_OR_DEPARTMENT_OTHER)
Admission: EM | Admit: 2020-01-08 | Discharge: 2020-01-08 | Disposition: A | Discharge status: Home / Self Care | Payer: Self-pay | Attending: Emergency Medicine | Admitting: Emergency Medicine

## 2020-01-08 ENCOUNTER — Other Ambulatory Visit: Payer: Self-pay

## 2020-01-08 ENCOUNTER — Telehealth (HOSPITAL_COMMUNITY): Payer: Self-pay

## 2020-01-08 DIAGNOSIS — Z91040 Latex allergy status: Secondary | ICD-10-CM | POA: Insufficient documentation

## 2020-01-08 DIAGNOSIS — R519 Headache, unspecified: Secondary | ICD-10-CM | POA: Insufficient documentation

## 2020-01-08 DIAGNOSIS — Z20822 Contact with and (suspected) exposure to covid-19: Secondary | ICD-10-CM | POA: Insufficient documentation

## 2020-01-08 DIAGNOSIS — R5383 Other fatigue: Secondary | ICD-10-CM | POA: Insufficient documentation

## 2020-01-08 DIAGNOSIS — B349 Viral infection, unspecified: Secondary | ICD-10-CM | POA: Insufficient documentation

## 2020-01-08 LAB — SARS CORONAVIRUS 2 BY RT PCR (HOSPITAL ORDER, PERFORMED IN ~~LOC~~ HOSPITAL LAB): SARS Coronavirus 2: NEGATIVE

## 2020-01-08 MED ORDER — KETOROLAC TROMETHAMINE 15 MG/ML IJ SOLN
15.0000 mg | Freq: Once | INTRAMUSCULAR | Status: AC
  Administered 2020-01-08: 15 mg via INTRAMUSCULAR
  Filled 2020-01-08: qty 1

## 2020-01-08 NOTE — Discharge Instructions (Signed)
Please read and follow all provided instructions.  Your diagnoses today include:  1. Viral syndrome    Tests performed today include:  Vital signs. See below for your results today.   COVID test - pending  Medications prescribed:  Please use over-the-counter NSAID medications (ibuprofen, naproxen) as directed on the packaging for pain.   Take any prescribed medications only as directed. Treatment for your infection is aimed at treating the symptoms. There are no medications, such as antibiotics, that will cure your infection.   Home care instructions:  Follow any educational materials contained in this packet.   Your illness is contagious and can be spread to others, especially during the first 3 or 4 days. It cannot be cured by antibiotics or other medicines. Take basic precautions such as washing your hands often, covering your mouth when you cough or sneeze, and avoiding public places where you could spread your illness to others.   Please continue drinking plenty of fluids.  Use over-the-counter medicines as needed as directed on packaging for symptom relief.  You may also use ibuprofen or tylenol as directed on packaging for pain or fever.  Do not take multiple medicines containing Tylenol or acetaminophen to avoid taking too much of this medication.  Follow-up instructions: Please follow-up with your primary care provider in the next 3 days for further evaluation of your symptoms if you are not feeling better.   Return instructions:   Please return to the Emergency Department if you experience worsening symptoms.   RETURN IMMEDIATELY IF you develop shortness of breath, confusion or altered mental status, a new rash, become dizzy, faint, or poorly responsive, or are unable to be cared for at home.  Please return if you have persistent vomiting and cannot keep down fluids or develop a fever that is not controlled by tylenol or motrin.    Please return if you have any other  emergent concerns.  Additional Information:  Your vital signs today were: BP (!) 138/84 (BP Location: Left Arm)   Pulse 84   Temp 98.4 F (36.9 C) (Oral)   Resp 16   Ht 4\' 11"  (1.499 m)   Wt 73.9 kg   LMP 06/11/2014   SpO2 98%   BMI 32.92 kg/m  If your blood pressure (BP) was elevated above 135/85 this visit, please have this repeated by your doctor within one month. --------------
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
## 2020-01-08 NOTE — ED Triage Notes (Signed)
C/o ha x 2 days  Then yesterday lost of taste and smell, dry cough and sore throat

## 2020-07-11 ENCOUNTER — Ambulatory Visit: Payer: Self-pay | Admitting: Physician Assistant

## 2020-07-28 ENCOUNTER — Ambulatory Visit: Payer: Self-pay | Admitting: Physician Assistant

## 2020-07-28 IMAGING — MR MR HEAD W/O CM
10 series · 48 of 48 positions shown · non-contrast
Comparison: CT head 01/27/2015

CLINICAL DATA: Worsening headaches

EXAM:
MRI HEAD WITHOUT CONTRAST
TECHNIQUE: Multiplanar, multiecho pulse sequences of the brain and surrounding
structures were obtained without intravenous contrast.

[Series 5: T1 · sagittal · 5.0mm · 0.45mm/px · 2 of 21 slices shown]
[im 1/21]
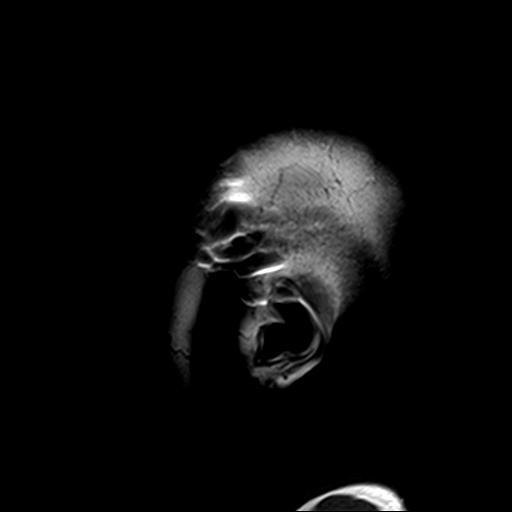
[im 21/21]
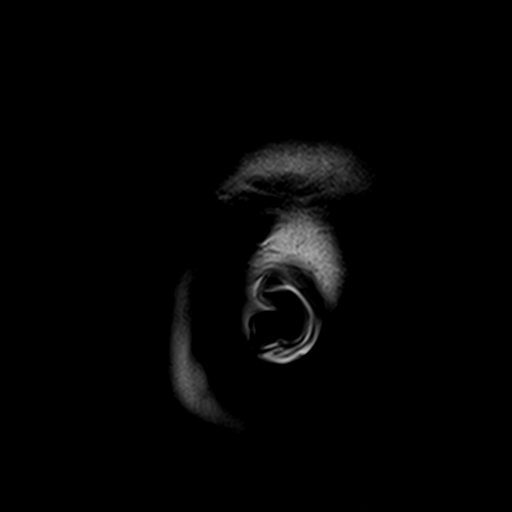

[Series 6: DWI · axial · 3.0mm · 1.80mm/px · z∈[-81,+53]mm · 9 of 94 slices shown (1 of 4)]
[im 1/94]
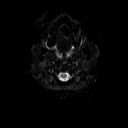
[im 12/94]
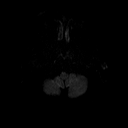
[im 24/94]
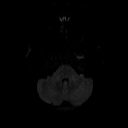
[im 35/94]
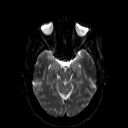
[im 47/94]
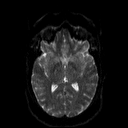
[im 59/94]
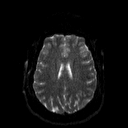
[im 70/94]
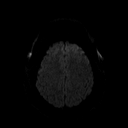
[im 82/94]
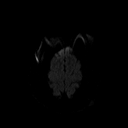
[im 94/94]
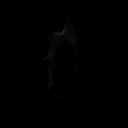

[Series 7: DWI · axial · 3.0mm · 1.80mm/px · z∈[-81,+53]mm · 4 of 47 slices shown (2 of 4)]
[im 1/47]
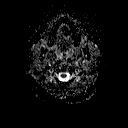
[im 16/47]
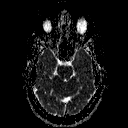
[im 31/47]
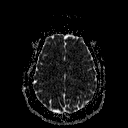
[im 47/47]
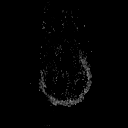

[Series 8: DWI · coronal · 5.0mm · 1.80mm/px · 6 of 67 slices shown (3 of 4)]
[im 1/67]
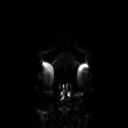
[im 14/67]
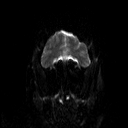
[im 27/67]
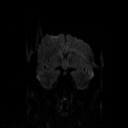
[im 40/67]
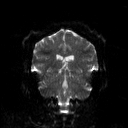
[im 53/67]
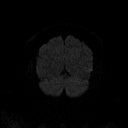
[im 67/67]
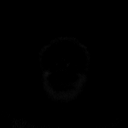

[Series 9: DWI · coronal · 5.0mm · 1.80mm/px · 3 of 34 slices shown (4 of 4)]
[im 1/34]
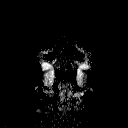
[im 17/34]
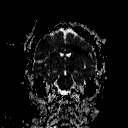
[im 34/34]
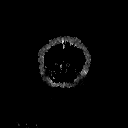

[Series 10: T2 · axial · 5.0mm · 0.51mm/px · z∈[-80,+56]mm · 2 of 21 slices shown (1 of 2)]
[im 1/21]
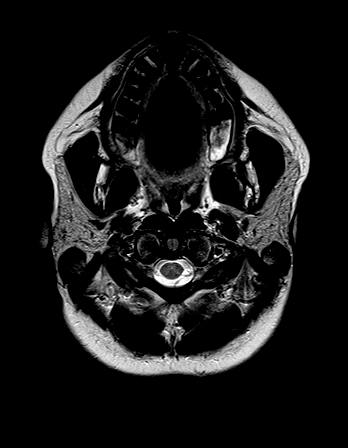
[im 21/21]
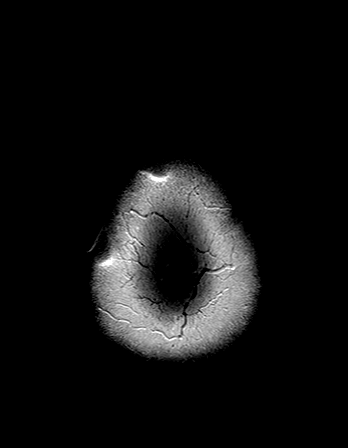

[Series 11: FLAIR · axial · 3.0mm · 0.45mm/px · z∈[-80,+51]mm · 3 of 30 slices shown]
[im 1/30]
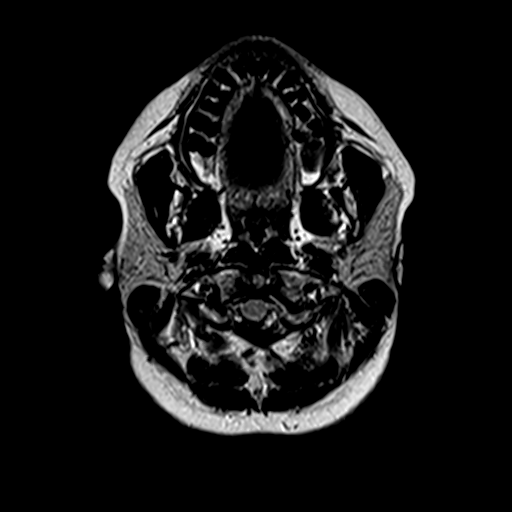
[im 15/30]
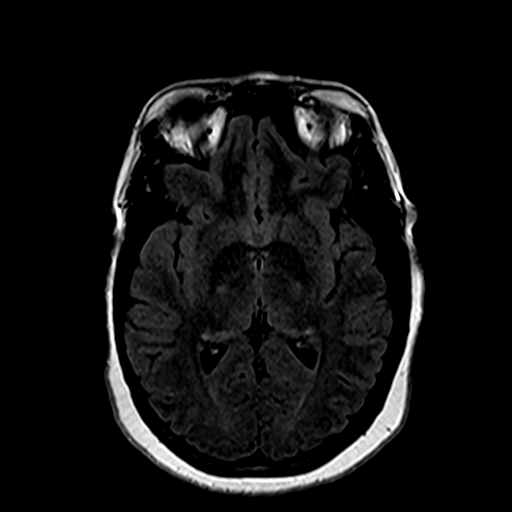
[im 30/30]
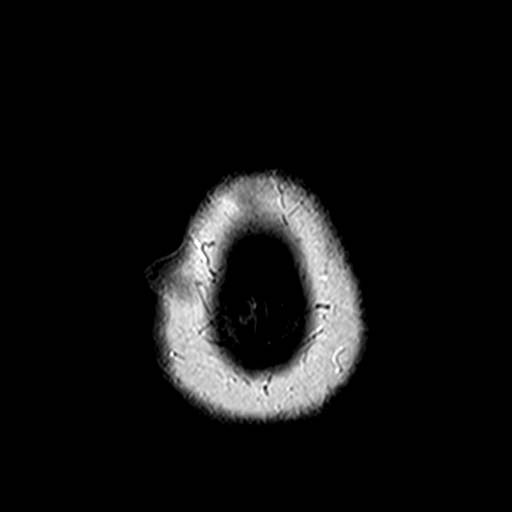

[Series 13: swi_images · axial · 5.0mm · 0.90mm/px · z∈[-79,+52]mm · 3 of 28 slices shown]
[im 1/28]
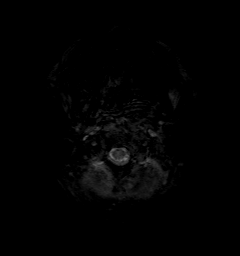
[im 14/28]
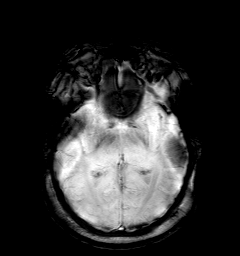
[im 28/28]
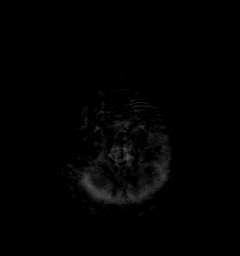

[Series 14: t1_mpr_tra · axial · 1.0mm · 0.71mm/px · z∈[-81,+58]mm · 14 of 144 slices shown]
[im 1/144]
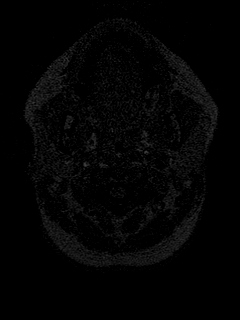
[im 12/144]
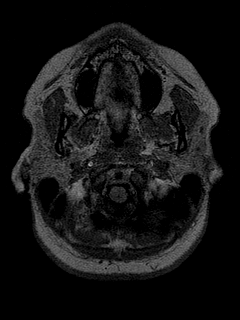
[im 23/144]
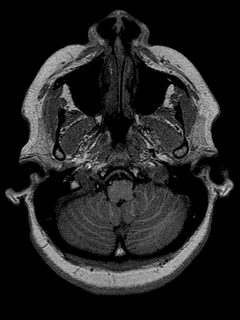
[im 34/144]
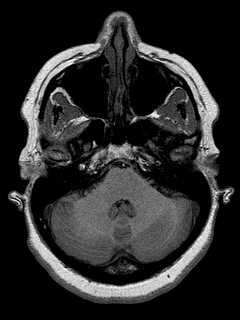
[im 45/144]
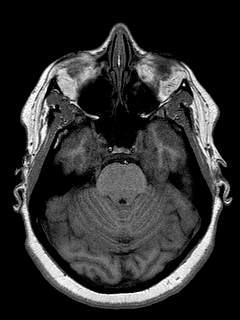
[im 56/144]
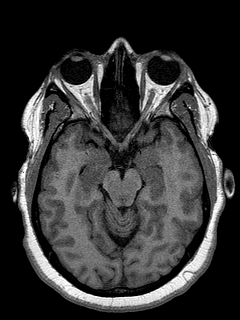
[im 67/144]
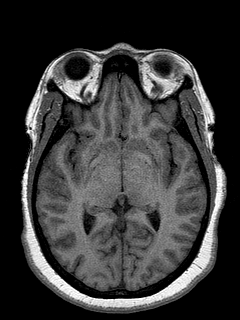
[im 78/144]
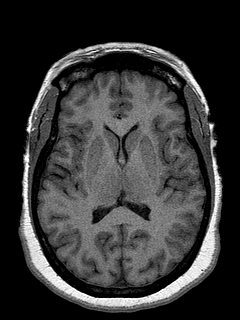
[im 89/144]
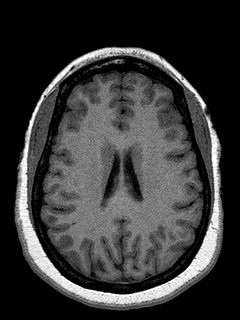
[im 100/144]
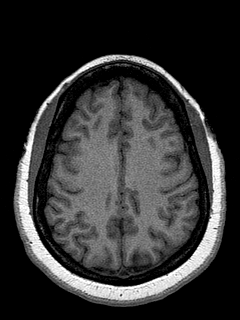
[im 111/144]
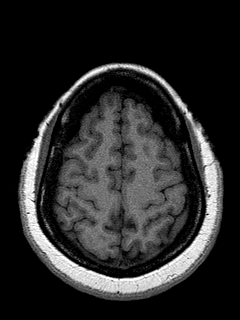
[im 122/144]
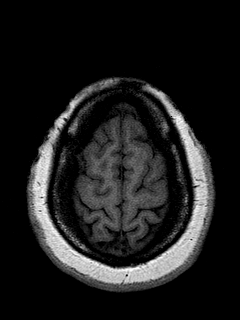
[im 133/144]
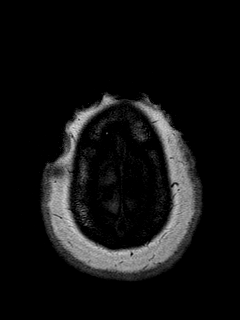
[im 144/144]
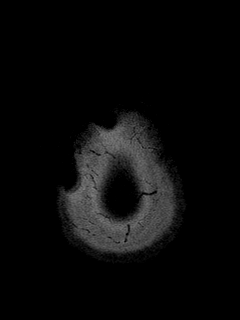

[Series 15: T2 · coronal · 5.0mm · 0.45mm/px · 2 of 25 slices shown (2 of 2)]
[im 1/25]
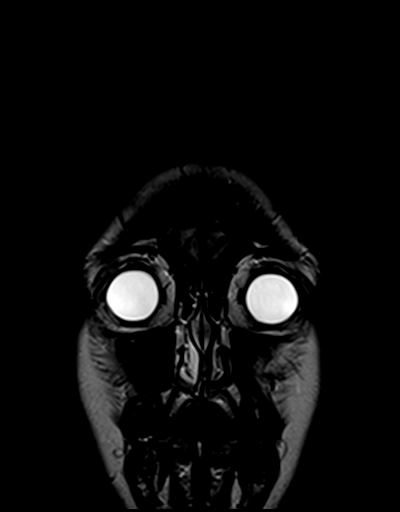
[im 25/25]
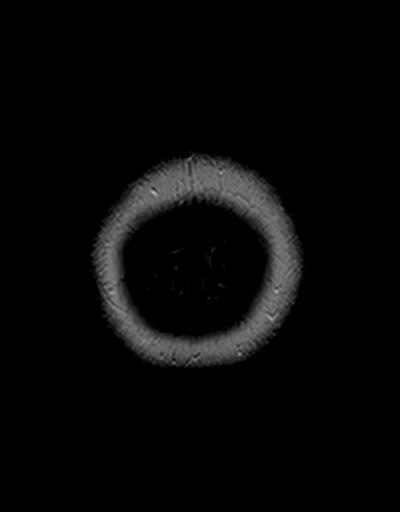

[48 of 48 positions shown; findings below may reference images not displayed]

FINDINGS: Brain: No acute infarction, hemorrhage, hydrocephalus, extra-axial
collection or mass lesion.

Vascular: Normal arterial flow void

Skull and upper cervical spine: Normal skeletal structures. Metallic
artifact in the frontal scalp bilaterally.

Sinuses/Orbits: Negative

Other: None
IMPRESSION: Normal MRI brain.

## 2020-07-29 ENCOUNTER — Encounter: Payer: Self-pay | Admitting: Physician Assistant

## 2020-08-11 ENCOUNTER — Ambulatory Visit (INDEPENDENT_AMBULATORY_CARE_PROVIDER_SITE_OTHER): Payer: BLUE CROSS/BLUE SHIELD | Admitting: Physician Assistant

## 2020-08-11 ENCOUNTER — Other Ambulatory Visit: Payer: Self-pay

## 2020-08-11 ENCOUNTER — Encounter: Payer: Self-pay | Admitting: Physician Assistant

## 2020-08-11 VITALS — BP 129/81 | HR 65 | Temp 97.8°F | Ht 60.0 in | Wt 172.0 lb

## 2020-08-11 DIAGNOSIS — M5412 Radiculopathy, cervical region: Secondary | ICD-10-CM | POA: Diagnosis not present

## 2020-08-11 MED ORDER — NAPROXEN 500 MG PO TABS: 500.0000 mg | ORAL_TABLET | Freq: Two times a day (BID) | ORAL | 0 refills | Status: DC

## 2020-08-11 NOTE — Progress Notes (Signed)
Established Patient Office Visit  Subjective:  Patient ID: [name], female    DOB: 29-Mar-1961  Age: 60 y.o. MRN: [id number]  CC:  Chief Complaint  Patient presents with  . Numbness    Left arm x 1 month    HPI [name] presents for transfer of care from [employer]. Mother of two sons, 24 & 79.   Acute concern today: Left arm pain x 1 month (she is R handed). Intermittent, feels like it goes numb from upper arm down to fingertips. Happens more often when sitting down, but less frequent than when it first began. No known injury. No new exercises. Strength feels good per patient. She has taken a few Tylenol but very rarely.  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 Labs and head MRI were done and found to be normal. She was diagnosed with possible vertigo and cervical radiculopathy. States she did not take the medications she was sent home with (Meclizine and Naproxen).  She does have a history of migraines about 2 years ago and was on Topamax and also receiving steroid injections. States she was going through divorce at that time, very stressful. She is now feeling much better and rarely has any migraines.   Dr. Osborn Coho is her OB/GYN. No other specialists.  She takes no daily medications.   Past Medical History:  Diagnosis Date  . Abnormal Pap smear 2001  . AMA (advanced maternal age) multigravida 35+   . ANEMIA 03/23/2010  . Dyspareunia 2008  . Female infertility   . GALLSTONES 03/23/2010  . H/O constipation 2001  . H/O mumps   . H/O varicella    As a child   . Headache(784.0) 12/18/2006   Frequently  . Heart murmur   . History of rubella    As a child   . Hx: UTI (urinary tract infection) 04/2003  . Migraine   . Mild mitral valve prolapse   . NONSPECIFIC ABNORM FIND RAD&OTH EXAM BILI TRACT 02/02/2008  . OVARIAN CYST 03/23/2010  . Ulcer 2001     Past Surgical History:  Procedure Laterality Date  . CESAREAN SECTION     x 2  . LAPAROSCOPIC ABLATION RENAL MASS    . LAPAROSCOPIC OVARIAN CYSTECTOMY    . WISDOM TOOTH EXTRACTION      Family History  Problem Relation Age of Onset  . Hypertension Mother   . Heart disease Father   . Hypertension Maternal Aunt   . Asthma Maternal Aunt   . Diabetes Maternal Aunt   . Colon cancer Neg Hx   . Esophageal cancer Neg Hx   . Stomach cancer Neg Hx   . Rectal cancer Neg Hx   . Breast cancer Neg Hx     Social History   Socioeconomic History  . Marital status: Married    Spouse name: Not on file  . Number of children: 2  . Years of education: Not on file  . Highest education level: Not on file  Occupational History    Employer: [employer]  Tobacco Use  . Smoking status: Never Smoker  . Smokeless tobacco: Never Used  Vaping Use  . Vaping Use: Never used  Substance and Sexual Activity  . Alcohol use: No    Alcohol/week: 0.0 standard drinks  . Drug use: No  . Sexual activity: Not on file  Other  Topics Concern  . Not on file  Social History Narrative  . Not on file   Social Determinants of Health   Financial Resource Strain: Not on file  Food Insecurity: Not on file  Transportation Needs: Not on file  Physical Activity: Not on file  Stress: Not on file  Social Connections: Not on file  Intimate Partner Violence: Not on file    Outpatient Medications Prior to Visit  Medication Sig Dispense Refill  . cyclobenzaprine (FLEXERIL) 10 MG tablet Take 1 tablet (10 mg total) by mouth 3 (three) times daily as needed for muscle spasms. 20 tablet 0  . Lysine 1000 MG TABS Take by mouth.    . diclofenac (VOLTAREN) 75 MG EC tablet Take 1 tablet (75 mg total) by mouth 2 (two) times daily. 60 tablet 1  . topiramate (TOPAMAX) 50 MG tablet Take 1 tablet (50 mg total) by mouth 2 (two) times daily. 60 tablet 5   No facility-administered medications prior to visit.     Allergies  Allergen Reactions  . Imitrex [Sumatriptan] Anaphylaxis  . Latex Itching  . Penicillins Hives and Swelling    ROS Review of Systems REFER TO HPI FOR PERTINENT POSITIVES AND NEGATIVES    Objective:    Physical Exam Vitals and nursing note reviewed.  Constitutional:      Appearance: Normal appearance. She is obese.  Musculoskeletal:     Right shoulder: Normal. No tenderness. Normal range of motion.     Left shoulder: Normal. No tenderness. Normal range of motion.     Right upper arm: Normal. No edema or tenderness.     Left upper arm: Normal. No edema or tenderness.     Cervical back: Full passive range of motion without pain and normal range of motion.  Lymphadenopathy:     Cervical: No cervical adenopathy.  Skin:    General: Skin is warm.  Neurological:     Mental Status: She is alert.     Comments: N/V intact     BP 129/81   Pulse 65   Temp 97.8 F (36.6 C)   Ht 5' (1.524 m)   Wt 172 lb (78 kg)   LMP 06/11/2014   SpO2 98%   BMI 33.59 kg/m  Wt Readings from Last 3 Encounters:  08/11/20 172 lb (78 kg)  01/08/20 163 lb (73.9 kg)  12/25/18 150 lb (68 kg)     Health Maintenance Due  Topic Date Due  . PAP SMEAR-Modifier  11/23/2017    There are no preventive care reminders to display for this patient.  Lab Results  Component Value Date   TSH 0.796 01/16/2013   Lab Results  Component Value Date   WBC 7.3 08/03/2016   HGB 11.1 (L) 08/03/2016   HCT 34.5 (L) 08/03/2016   MCV 92.7 08/03/2016   PLT 333 08/03/2016   Lab Results  Component Value Date   NA 142 08/03/2016   K 3.7 08/03/2016   CO2 25 08/03/2016   GLUCOSE 71 08/03/2016   BUN 16 08/03/2016   CREATININE 0.88 08/03/2016   BILITOT 0.2 08/03/2016   ALKPHOS 53 08/03/2016   AST 13 08/03/2016   ALT 10 08/03/2016   PROT 7.1 08/03/2016   ALBUMIN 4.2 08/03/2016   CALCIUM 9.8 08/03/2016   ANIONGAP 6 04/26/2016   Lab Results  Component Value Date   CHOL 135 01/16/2013    Lab Results  Component Value Date   HDL 44 01/16/2013   Lab Results  Component Value  Date   LDLCALC 84 01/16/2013   Lab Results  Component Value Date   TRIG 33 01/16/2013   Lab Results  Component Value Date   CHOLHDL 3.1 01/16/2013   No results found for: HGBA1C    Assessment & Plan:   Problem List Items Addressed This Visit   None   Visit Diagnoses    Cervical radiculopathy    -  Primary      Meds ordered this encounter  Medications  . naproxen (NAPROSYN) 500 MG tablet    Sig: Take 1 tablet (500 mg total) by mouth 2 (two) times daily with a meal.    Dispense:  30 tablet    Refill:  0    Follow-up: Return in about 5 months (around 01/11/2021) for CPE.   1. Cervical radiculopathy Improving intermittent symptoms. Recommended Naproxen daily and resting this arm to help relieve it in its entirety. If still no improvement, next steps would be imaging the neck and left shoulder. She is going to call with an update in the next month.  This visit occurred during the SARS-CoV-2 public health emergency.  Safety protocols were in place, including screening questions prior to the visit, additional usage of staff PPE, and extensive cleaning of exam room while observing appropriate contact time as indicated for disinfecting solutions.     Cyana Shook M Ezra Marquess, PA-C

## 2020-08-11 NOTE — Patient Instructions (Signed)
Very good to meet you today!  I think you are having a type of radiculopathy causing your symptoms, possibly stemming from your neck or left shoulder. Please take the Naproxen twice daily with food. Avoid strenuous activity with this arm for now. May try gentle stretches and heat or ice to neck.  Please call back in the next 3-4 weeks if worse or no better.  I will see you back later this year for a complete physical.     Cervical Radiculopathy  Cervical radiculopathy happens when a nerve in the neck (a cervical nerve) is pinched or bruised. This condition can happen because of an injury to the cervical spine (vertebrae) in the neck, or as part of the normal aging process. Pressure on the cervical nerves can cause pain or numbness that travels from the neck all the way down into the arm and fingers. Usually, this condition gets better with rest. Treatment may be needed if the condition does not improve. What are the causes? This condition may be caused by:  A neck injury.  A bulging (herniated) disk.  Muscle spasms.  Muscle tightness in the neck because of overuse.  Arthritis.  Breakdown or degeneration in the bones and joints of the spine (spondylosis) due to aging.  Bone spurs that may develop near the cervical nerves. What are the signs or symptoms? Symptoms of this condition include:  Pain. The pain may travel from the neck to the arm and hand. The pain can be severe or irritating. It may be worse when you move your neck.  Numbness or tingling in your arm or hand.  Weakness in the affected arm and hand, in severe cases. How is this diagnosed? This condition may be diagnosed based on your symptoms, your medical history, and a physical exam. You may also have tests, including:  X-rays.  A CT scan.  An MRI.  An electromyogram (EMG).  Nerve conduction tests. How is this treated? In many cases, treatment is not needed for this condition. With rest, the condition usually  gets better over time. If treatment is needed, options may include:  Wearing a soft neck collar (cervical collar) for short periods of time, as told by your health care provider.  Doing physical therapy to strengthen your neck muscles.  Taking medicines, such as NSAIDs or oral corticosteroids.  Having spinal injections, in severe cases.  Having surgery. This may be needed if other treatments do not help. Different types of surgery may be done depending on the cause of this condition. Follow these instructions at home: If you have a cervical collar:  Wear it as told by your health care provider. Remove it only as told by your health care provider.  Ask your health care provider if you can remove the collar for cleaning and bathing. If you are allowed to remove the collar for cleaning or bathing: ? Follow instructions from your health care provider about how to remove the collar safely. ? Clean the collar by wiping it with mild soap and water and drying it completely. ? Take out any removable pads in the collar every 1-2 days, and wash them by hand with soap and water. Let them air-dry completely before you put them back in the collar. ? Check your skin under the collar for irritation or sores. If you see any, tell your health care provider. Managing pain  Take over-the-counter and prescription medicines only as told by your health care provider.  If directed, put ice on the  affected area. ? If you have a soft neck collar, remove it as told by your health care provider. ? Put ice in a plastic bag. ? Place a towel between your skin and the bag. ? Leave the ice on for 20 minutes, 2-3 times a day.  If applying ice does not help, you can try using heat. Use the heat source that your health care provider recommends, such as a moist heat pack or a heating pad. ? Place a towel between your skin and the heat source. ? Leave the heat on for 20-30 minutes. ? Remove the heat if your skin turns  bright red. This is especially important if you are unable to feel pain, heat, or cold. You may have a greater risk of getting burned.  Try a gentle neck and shoulder massage to help relieve symptoms.      Activity  Rest as needed.  Return to your normal activities as told by your health care provider. Ask your health care provider what activities are safe for you.  Do stretching and strengthening exercises as told by your health care provider or physical therapist.  Do not lift anything that is heavier than 10 lb (4.5 kg) until your health care provider tells you that it is safe. General instructions  Use a flat pillow when you sleep.  Do not drive while wearing a cervical collar. If you do not have a cervical collar, ask your health care provider if it is safe to drive while your neck heals.  Ask your health care provider if the medicine prescribed to you requires you to avoid driving or using heavy machinery.  Do not use any products that contain nicotine or tobacco, such as cigarettes, e-cigarettes, and chewing tobacco. These can delay healing. If you need help quitting, ask your health care provider.  Keep all follow-up visits as told by your health care provider. This is important. Contact a health care provider if:  Your condition does not improve with treatment. Get help right away if:  Your pain gets much worse and cannot be controlled with medicines.  You have weakness or numbness in your hand, arm, face, or leg.  You have a high fever.  You have a stiff, rigid neck.  You lose control of your bowels or your bladder (have incontinence).  You have trouble with walking, balance, or speaking. Summary  Cervical radiculopathy happens when a nerve in the neck is pinched or bruised.  A nerve can get pinched from a bulging disk, arthritis, muscle spasms, or an injury to the neck.  Symptoms include pain, tingling, or numbness radiating from the neck into the arm or  hand. Weakness can also occur in severe cases.  Treatment may include rest, wearing a cervical collar, and physical therapy. Medicines may be prescribed to help with pain. In severe cases, injections or surgery may be needed. This information is not intended to replace advice given to you by your health care provider. Make sure you discuss any questions you have with your health care provider. Document Revised: 04/18/2018 Document Reviewed: 04/18/2018 Elsevier Patient Education  2021 [employer].

## 2020-10-05 DIAGNOSIS — H5213 Myopia, bilateral: Secondary | ICD-10-CM | POA: Diagnosis not present

## 2020-10-05 DIAGNOSIS — H52222 Regular astigmatism, left eye: Secondary | ICD-10-CM | POA: Diagnosis not present

## 2020-10-05 DIAGNOSIS — H524 Presbyopia: Secondary | ICD-10-CM | POA: Diagnosis not present

## 2020-10-13 ENCOUNTER — Telehealth: Payer: Self-pay

## 2020-10-13 NOTE — Telephone Encounter (Signed)
I do not see a question for me.  When triaged and sent to me please let me know what the specific question is so I can help address it.  It appears she was triaged to urgent care or emergency department due to chest pain-unless we have immediate availability then we need to reinforce disposition

## 2020-10-13 NOTE — Telephone Encounter (Signed)
Nurse Assessment Nurse: Stefano Gaul, RN, Dwana Curd Date/Time (Eastern Time): 10/13/2020 9:41:12 AM Confirm and document reason for call. If symptomatic, describe symptoms. ---Caller states she has fatigue. has pain on the right side of her chest wall. no SOB. no fever. has chest tightness and hurts when she breathes in. Does the patient have any new or worsening symptoms? ---Yes Will a triage be completed? ---Yes Related visit to physician within the last 2 weeks? ---No Does the PT have any chronic conditions? (i.e. diabetes, asthma, this includes High risk factors for pregnancy, etc.) ---Yes List chronic conditions. ---costochondritis Is this a behavioral health or substance abuse call? ---No Guidelines Guideline Title Affirmed Question Affirmed Notes Nurse Date/Time [name] Time) Chest Pain Taking a deep breath makes pain worse Stefano Gaul, RN, [name] 10/13/2020 9:44:09 AM Disp. Time [name] Time) Disposition Final User 10/13/2020 9:36:44 AM Send to Urgent Queue [name] 10/13/2020 9:49:57 AM Go to ED Now (or PCP triage) Yes Stefano Gaul, RN, [name] PLEASE NOTE: All timestamps contained within this report are represented as [place] Standard Time. CONFIDENTIALTY NOTICE: This fax transmission is intended only for the addressee. It contains information that is legally privileged, confidential or otherwise protected from use or disclosure. If you are not the intended recipient, you are strictly prohibited from reviewing, disclosing, copying using or disseminating any of this information or taking any action in reliance on or regarding this information. If you have received this fax in error, please notify us immediately by telephone so that we can arrange for its return to [place]. Phone: [phone], Toll-Free: [phone], Fax: [phone] Page: 2 of 2 Call Id: [id number] Caller Disagree/Comply Comply Caller Understands Yes PreDisposition Call Doctor Care Advice Given Per Guideline GO TO ED NOW (OR PCP  TRIAGE): * IF NO PCP (PRIMARY CARE PROVIDER) SECOND-LEVEL TRIAGE: You need to be seen within the next hour. Go to the ED/UCC at _____________ Hospital. Leave as soon as you can. Comments User: [name] Date/Time [name] Time): 10/13/2020 9:35:12 AM Caller transferred from the office. Referrals GO TO FACILITY UNDECIDE

## 2020-10-14 ENCOUNTER — Emergency Department (HOSPITAL_BASED_OUTPATIENT_CLINIC_OR_DEPARTMENT_OTHER)
Admission: EM | Admit: 2020-10-14 | Discharge: 2020-10-14 | Disposition: A | Discharge status: Home / Self Care | Payer: BLUE CROSS/BLUE SHIELD | Attending: Emergency Medicine | Admitting: Emergency Medicine

## 2020-10-14 ENCOUNTER — Other Ambulatory Visit: Payer: Self-pay

## 2020-10-14 ENCOUNTER — Emergency Department (HOSPITAL_BASED_OUTPATIENT_CLINIC_OR_DEPARTMENT_OTHER): Payer: BLUE CROSS/BLUE SHIELD

## 2020-10-14 DIAGNOSIS — M546 Pain in thoracic spine: Secondary | ICD-10-CM | POA: Insufficient documentation

## 2020-10-14 DIAGNOSIS — R0602 Shortness of breath: Secondary | ICD-10-CM | POA: Diagnosis not present

## 2020-10-14 DIAGNOSIS — R079 Chest pain, unspecified: Secondary | ICD-10-CM | POA: Diagnosis not present

## 2020-10-14 DIAGNOSIS — M79675 Pain in left toe(s): Secondary | ICD-10-CM | POA: Insufficient documentation

## 2020-10-14 DIAGNOSIS — R0789 Other chest pain: Secondary | ICD-10-CM | POA: Diagnosis not present

## 2020-10-14 DIAGNOSIS — Z91040 Latex allergy status: Secondary | ICD-10-CM | POA: Diagnosis not present

## 2020-10-14 LAB — CBC WITH DIFFERENTIAL/PLATELET
Abs Immature Granulocytes: 0.01 10*3/uL (ref 0.00–0.07)
Basophils Absolute: 0 10*3/uL (ref 0.0–0.1)
Basophils Relative: 1 %
Eosinophils Absolute: 0.1 10*3/uL (ref 0.0–0.5)
Eosinophils Relative: 2 %
HCT: 35 % — ABNORMAL LOW (ref 36.0–46.0)
Hemoglobin: 11.5 g/dL — ABNORMAL LOW (ref 12.0–15.0)
Immature Granulocytes: 0 %
Lymphocytes Relative: 33 %
Lymphs Abs: 1.6 10*3/uL (ref 0.7–4.0)
MCH: 29.9 pg (ref 26.0–34.0)
MCHC: 32.9 g/dL (ref 30.0–36.0)
MCV: 90.9 fL (ref 80.0–100.0)
Monocytes Absolute: 0.6 10*3/uL (ref 0.1–1.0)
Monocytes Relative: 12 %
Neutro Abs: 2.5 10*3/uL (ref 1.7–7.7)
Neutrophils Relative %: 52 %
Platelets: 334 10*3/uL (ref 150–400)
RBC: 3.85 MIL/uL — ABNORMAL LOW (ref 3.87–5.11)
RDW: 14.3 % (ref 11.5–15.5)
WBC: 4.9 10*3/uL (ref 4.0–10.5)
nRBC: 0 % (ref 0.0–0.2)

## 2020-10-14 LAB — TROPONIN I (HIGH SENSITIVITY): Troponin I (High Sensitivity): <2 ng/L (ref <18)

## 2020-10-14 LAB — BASIC METABOLIC PANEL
Anion gap: 9 (ref 5–15)
BUN: 17 mg/dL (ref 6–20)
CO2: 26 mmol/L (ref 22–32)
Calcium: 9.6 mg/dL (ref 8.9–10.3)
Chloride: 103 mmol/L (ref 98–111)
Creatinine, Ser: 0.71 mg/dL (ref 0.44–1.00)
GFR, Estimated: >60 mL/min (ref >60)
Glucose, Bld: 96 mg/dL (ref 70–99)
Potassium: 4 mmol/L (ref 3.5–5.1)
Sodium: 138 mmol/L (ref 135–145)

## 2020-10-14 NOTE — ED Provider Notes (Signed)
[facility] EMERGENCY DEPARTMENT Provider Note   CSN: [id number] Arrival date & time: 10/14/20  1149     History Chief Complaint  Patient presents with  . Chest Pain    [name] is a 60 y.o. female.  Patient is a 60 year old female with a history of anemia, gallstones, heart murmur, mild mitral valve prolapse and prior costochondritis who is presenting today with complaint of right-sided chest and side pain.  Patient states that symptoms started on Monday or Tuesday which was 4 to 5 days prior to arrival.  She cannot recall any trauma but does have a very strenuous job at [employer] on the weekends and does a lot of lifting.  She has noticed the pain has not improved with rest and ibuprofen.  It seems to be worse with taking a deep breath and certain movements.  It wraps around the side of her ribs and slightly in her upper back.  She has noticed slight shortness of breath but denies any significant cough, fever, nausea, vomiting, abdominal pain.  She has had no recent immobilization.  No prior history of clots and she does not take any estrogen containing products.  She does not use drugs, alcohol or tobacco.  No prior history of ACS.  She did notice some pain in her left great toe last night but no notable swelling or pain in either calf.  The history is provided by the patient.  Chest Pain      Past Medical History:  Diagnosis Date  . Abnormal Pap smear 2001  . AMA (advanced maternal age) multigravida 35+   . ANEMIA 03/23/2010  . Dyspareunia 2008  . Female infertility   . GALLSTONES 03/23/2010  . H/O constipation 2001  . H/O mumps   . H/O varicella    As a child   . Headache(784.0) 12/18/2006   Frequently  . Heart murmur   . History of rubella    As a child   . Hx: UTI (urinary tract infection) 04/2003  . Migraine   . Mild mitral valve prolapse   . NONSPECIFIC ABNORM FIND RAD&OTH EXAM BILI TRACT 02/02/2008  . OVARIAN CYST 03/23/2010  . Ulcer 2001    Patient  Active Problem List   Diagnosis Date Noted  . Rotator cuff impingement syndrome of right shoulder 12/25/2018  . ANEMIA 03/23/2010  . GALLSTONES 03/23/2010  . OVARIAN CYST 03/23/2010  . NONSPECIFIC ABNORM FIND RAD&OTH EXAM BILI TRACT 02/02/2008  . History of migraine headaches 12/18/2006    Past Surgical History:  Procedure Laterality Date  . CESAREAN SECTION     x 2  . LAPAROSCOPIC ABLATION RENAL MASS    . LAPAROSCOPIC OVARIAN CYSTECTOMY    . WISDOM TOOTH EXTRACTION       OB History    Gravida  2   Para  2   Term  2   Preterm      AB      Living  2     SAB      IAB      Ectopic      Multiple      Live Births  2           Family History  Problem Relation Age of Onset  . Hypertension Mother   . Heart disease Father   . Hypertension Maternal Aunt   . Asthma Maternal Aunt   . Diabetes Maternal Aunt   . Colon cancer Neg Hx   . Esophageal cancer Neg  Hx   . Stomach cancer Neg Hx   . Rectal cancer Neg Hx   . Breast cancer Neg Hx     Social History   Tobacco Use  . Smoking status: Never Smoker  . Smokeless tobacco: Never Used  Vaping Use  . Vaping Use: Never used  Substance Use Topics  . Alcohol use: No    Alcohol/week: 0.0 standard drinks  . Drug use: No    Home Medications Prior to Admission medications   Medication Sig Start Date End Date Taking? Authorizing Provider  naproxen (NAPROSYN) 500 MG tablet Take 1 tablet (500 mg total) by mouth 2 (two) times daily with a meal. 08/11/20   Allwardt, Alyssa M, PA-C    Allergies    Imitrex [sumatriptan], Latex, and Penicillins  Review of Systems   Review of Systems  Cardiovascular: Positive for chest pain.  All other systems reviewed and are negative.   Physical Exam Updated Vital Signs BP 132/67 (BP Location: Right Arm)   Pulse 71   Temp 98 F (36.7 C) (Oral)   Resp 18   Ht 5' (1.524 m)   Wt 76.7 kg   LMP 06/11/2014   SpO2 98%   BMI 33.01 kg/m   Physical Exam Vitals and nursing  note reviewed.  Constitutional:      General: She is not in acute distress.    Appearance: She is well-developed.  HENT:     Head: Normocephalic and atraumatic.     Nose: Nose normal.  Eyes:     Pupils: Pupils are equal, round, and reactive to light.  Cardiovascular:     Rate and Rhythm: Normal rate and regular rhythm.     Pulses: Normal pulses.     Heart sounds: Normal heart sounds. No murmur heard. No friction rub.  Pulmonary:     Effort: Pulmonary effort is normal.     Breath sounds: Normal breath sounds. No wheezing or rales.    Chest:     Chest wall: Tenderness present.    Abdominal:     General: Bowel sounds are normal. There is no distension.     Palpations: Abdomen is soft.     Tenderness: There is no abdominal tenderness. There is no guarding or rebound.  Musculoskeletal:        General: No tenderness. Normal range of motion.     Cervical back: Normal range of motion and neck supple.     Right lower leg: No edema.     Left lower leg: No edema.     Comments: No edema  Skin:    General: Skin is warm and dry.     Findings: No rash.  Neurological:     Mental Status: She is alert and oriented to person, place, and time.     Cranial Nerves: No cranial nerve deficit.  Psychiatric:        Behavior: Behavior normal.     ED Results / Procedures / Treatments   Labs (all labs ordered are listed, but only abnormal results are displayed) Labs Reviewed  CBC WITH DIFFERENTIAL/PLATELET - Abnormal; Notable for the following components:      Result Value   RBC 3.85 (*)    Hemoglobin 11.5 (*)    HCT 35.0 (*)    All other components within normal limits  BASIC METABOLIC PANEL  TROPONIN I (HIGH SENSITIVITY)    EKG EKG Interpretation  Date/Time:  Friday Oct 14 2020 11:58:39 EDT Ventricular Rate:  69 PR Interval:  142  QRS Duration: 85 QT Interval:  383 QTC Calculation: 411 R Axis:   15 Text Interpretation: Sinus rhythm Low voltage, precordial leads No significant  change since last tracing Confirmed by Gwyneth Sprout ([id number]) on 10/14/2020 12:04:36 PM   Radiology DG Chest Port 1 View  Result Date: 10/14/2020 CLINICAL DATA:  Mid chest pain and tightness for 4 days. EXAM: PORTABLE CHEST 1 VIEW COMPARISON:  None. FINDINGS: The lungs are clear. Heart size is normal. Aortic atherosclerosis. No pneumothorax or pleural fluid. No acute or focal bony abnormality. IMPRESSION: No acute disease. Aortic Atherosclerosis (ICD10-I70.0). Electronically Signed   By: Drusilla Kanner [name]   On: 10/14/2020 13:16    Procedures Procedures   Medications Ordered in ED Medications - No data to display  ED Course  I have reviewed the triage vital signs and the nursing notes.  Pertinent labs & imaging results that were available during my care of the patient were reviewed by me and considered in my medical decision making (see chart for details).    MDM Rules/Calculators/A&P                          Patient presenting today with symptoms most classic for chest wall pain.  Pain is reproduced by deep breathing, movements and palpation.  It is mostly in the right lateral ribs and in the back.  Patient is satting 100% on room air and is not tachycardic.  She is low risk [place] and low suspicion for PE at this time.  Patient's EKG without acute changes and low suspicion for ACS.  She denies any symptoms classic for pneumonia or infectious etiology.  No evidence of shingles on exam.  Chest x-ray and lab work are pending.  1:43 PM Labs reassuring.  Trop is neg.  CXR without acute findings.  Suspect MSK pain.  Will have pt continue  Ibuprofen and heat.  MDM Number of Diagnoses or Management Options   Amount and/or Complexity of Data Reviewed Clinical lab tests: ordered and reviewed Tests in the radiology section of CPT: ordered and reviewed Tests in the medicine section of CPT: ordered and reviewed Independent visualization of images, tracings, or specimens:  yes     Final Clinical Impression(s) / ED Diagnoses Final diagnoses:  Chest wall pain    Rx / DC Orders ED Discharge Orders    None       Gwyneth Sprout, MD 10/14/20 1345

## 2020-10-14 NOTE — Discharge Instructions (Addendum)
All the blood work today looks good.  No evidence of any problems with your heart or kidney.  Your x-ray showed normal lungs and heart size.  No signs of any masses, or abnormality with your lungs.  This is most likely an issue with the muscle.  Avoid any heavy lifting.  Continue use ibuprofen and try heat to the area.

## 2020-10-14 NOTE — ED Notes (Signed)
ED Provider at bedside. 

## 2020-10-14 NOTE — ED Triage Notes (Signed)
Pt arrives today with 4 day complaint of mid chest pain and tightness that radiates to the right torso and down to the right lower back area.  She reports she has had costochondritis in the past for which Ibuprofen and rest have helped.  Ibuprofen and rest have not helped this time.

## 2020-12-27 ENCOUNTER — Encounter: Payer: Self-pay | Admitting: Family Medicine

## 2020-12-27 ENCOUNTER — Other Ambulatory Visit: Payer: Self-pay

## 2020-12-27 ENCOUNTER — Telehealth (INDEPENDENT_AMBULATORY_CARE_PROVIDER_SITE_OTHER): Payer: BLUE CROSS/BLUE SHIELD | Admitting: Family Medicine

## 2020-12-27 DIAGNOSIS — M549 Dorsalgia, unspecified: Secondary | ICD-10-CM | POA: Diagnosis not present

## 2020-12-27 DIAGNOSIS — J0100 Acute maxillary sinusitis, unspecified: Secondary | ICD-10-CM | POA: Diagnosis not present

## 2020-12-27 MED ORDER — AZITHROMYCIN 250 MG PO TABS: ORAL_TABLET | ORAL | 0 refills | Status: DC

## 2020-12-27 NOTE — Progress Notes (Signed)
Virtual Visit via Video Note  Subjective  CC:  Chief Complaint  Patient presents with   Sinusitis   Nasal Congestion    And pressure   Back Pain    Started on right side and its traveling. Patient states its more uncomfortable when she breathes    Same day acute visit; PCP not available. New pt to me. Chart reviewed.   I connected with [name] on 12/27/20 at 10:30 AM EDT by a video enabled telemedicine application and verified that I am speaking with the correct person using two identifiers. Location patient: Home Location provider: [facility] [address], Office Persons participating in the virtual visit: [name], Willow Ora, MD [facility] CMA  I discussed the limitations of evaluation and management by telemedicine and the availability of in person appointments. The patient expressed understanding and agreed to proceed. HPI: [name] is a 60 y.o. female who was contacted today to address the problems listed above in the chief complaint. 60 year old female complains of right-sided headache, behind orbit and right-sided facial pain associated with puffiness, thick postnasal drainage and mild sore throat.  Has not been tested for COVID.  No fevers or chills.  Believes it could be a sinus infection.  No ear pain.  No shortness of breath Also complains of 3-day history of right-sided back pain, starts in right thoracic area and travels down to the low back.  Worse with certain positions and deep breathing.  No shortness of breath or cough.  No urinary symptoms or GI symptoms.  Has taken Tylenol and Advil without relief.  She reports that its max it was a 10 out of 10, currently reports 7 out of 10 in pain.  She has history of back pain but never felt like this before.  History of sciatica and a slipped disc.  These feel different.  She denies injury.  No lower extremity symptoms.  Assessment  1. Acute non-recurrent maxillary sinusitis   2. Acute  right-sided back pain, unspecified back location      Plan  Possible sinusitis right: Recommend home testing for COVID.  If negative, treat for sinusitis.  Z-Pak ordered.  Mucinex and supportive care discussed. Right-sided back pain: Given it radiates to low back and is positional in nature, doubt pulmonary etiology.  Patient appears comfortable currently.  Recommend in person visit for further evaluation with PCP.  Patient to call and schedule  I discussed the assessment and treatment plan with the patient. The patient was provided an opportunity to ask questions and all were answered. The patient agreed with the plan and demonstrated an understanding of the instructions.   The patient was advised to call back or seek an in-person evaluation if the symptoms worsen or if the condition fails to improve as anticipated. Follow up: Check back pain 01/11/2021  Meds ordered this encounter  Medications   azithromycin (ZITHROMAX) 250 MG tablet    Sig: Take 2 tabs today, then 1 tab daily for 4 days    Dispense:  1 each    Refill:  0      I reviewed the patients updated PMH, FH, and SocHx.    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 Family History: Patient family history includes Asthma in her maternal aunt; Diabetes in her maternal aunt; Heart disease in her father; Hypertension in her maternal aunt and mother. Social History:  Patient  reports that she has never smoked. She has never used smokeless tobacco. She reports that she does not  drink alcohol and does not use drugs.  Review of Systems: Constitutional: Negative for fever malaise or anorexia Cardiovascular: negative for chest pain Respiratory: negative for SOB or persistent cough Gastrointestinal: negative for abdominal pain  OBJECTIVE Vitals: LMP 06/11/2014  General: no acute distress , A&Ox3, appears comfortable, normal speech.  No respiratory distress  Willow Ora, MD

## 2020-12-27 NOTE — Patient Instructions (Addendum)
Please schedule with Alyssa for evaluation of your back pain. Please check a rapid covid test; can be in person visit if negative.

## 2021-01-11 ENCOUNTER — Encounter: Payer: BLUE CROSS/BLUE SHIELD | Admitting: Physician Assistant

## 2021-01-20 ENCOUNTER — Ambulatory Visit: Payer: BLUE CROSS/BLUE SHIELD | Admitting: Registered Nurse

## 2021-01-24 ENCOUNTER — Other Ambulatory Visit: Payer: Self-pay

## 2021-01-24 ENCOUNTER — Ambulatory Visit (INDEPENDENT_AMBULATORY_CARE_PROVIDER_SITE_OTHER): Payer: BLUE CROSS/BLUE SHIELD | Admitting: Physician Assistant

## 2021-01-24 ENCOUNTER — Ambulatory Visit: Payer: BLUE CROSS/BLUE SHIELD | Admitting: Physician Assistant

## 2021-01-24 ENCOUNTER — Encounter: Payer: Self-pay | Admitting: Physician Assistant

## 2021-01-24 VITALS — BP 124/78 | HR 70 | Temp 98.1°F | Ht 60.0 in | Wt 171.2 lb

## 2021-01-24 DIAGNOSIS — R21 Rash and other nonspecific skin eruption: Secondary | ICD-10-CM | POA: Diagnosis not present

## 2021-01-24 MED ORDER — TRIAMCINOLONE ACETONIDE 0.1 % EX CREA: TOPICAL_CREAM | CUTANEOUS | 0 refills | Status: DC

## 2021-01-24 NOTE — Patient Instructions (Signed)
Eczema - like rash. Could be secondary to new body wash. Return to your regular body wash. MOISTURIZE twice daily, especially after showers. Aveeno, Eucerin, Lubriderm are all good choices. Rx for Triamcinolone only as needed for severe dryness or itching. Claritin or Zyrtec daily can help as well.

## 2021-01-24 NOTE — Progress Notes (Signed)
Acute Office Visit  Subjective:    Patient ID: [name], female    DOB: 05-Jul-1960, 60 y.o.   MRN: [id number]  No chief complaint on file.   HPI Patient is in today for rash.  Patient states that over the last few weeks she has noticed itching and dryness on the insides of her elbows, around her neck, on her upper arms, and now she is feeling it on the back of her knees as well.  She has never had a history of eczema, but thinks this is what it is.  She has been moisturizing with Eucerin lotion at home and she has also been using over-the-counter hydrocortisone cream consistently for the last 2 weeks.  Says that things are looking better today.  She did switch body wash just prior to having these issues.  Past Medical History:  Diagnosis Date   Abnormal Pap smear 2001   AMA (advanced maternal age) multigravida 35+    ANEMIA 03/23/2010   Dyspareunia 2008   Female infertility    GALLSTONES 03/23/2010   H/O constipation 2001   H/O mumps    H/O varicella    As a child    Headache(784.0) 12/18/2006   Frequently   Heart murmur    History of rubella    As a child    Hx: UTI (urinary tract infection) 04/2003   Migraine    Mild mitral valve prolapse    NONSPECIFIC ABNORM FIND RAD&OTH EXAM BILI TRACT 02/02/2008   OVARIAN CYST 03/23/2010   Ulcer 2001    Past Surgical History:  Procedure Laterality Date   CESAREAN SECTION     x 2   LAPAROSCOPIC ABLATION RENAL MASS     LAPAROSCOPIC OVARIAN CYSTECTOMY     WISDOM TOOTH EXTRACTION      Family History  Problem Relation Age of Onset   Hypertension Mother    Heart disease Father    Hypertension Maternal Aunt    Asthma Maternal Aunt    Diabetes Maternal Aunt    Colon cancer Neg Hx    Esophageal cancer Neg Hx    Stomach cancer Neg Hx    Rectal cancer Neg Hx    Breast cancer Neg Hx     Social History   Socioeconomic History   Marital status: Married    Spouse name: Not on file   Number of children: 2   Years of  education: Not on file   Highest education level: Not on file  Occupational History    Employer: [employer]  Tobacco Use   Smoking status: Never   Smokeless tobacco: Never  Vaping Use   Vaping Use: Never used  Substance and Sexual Activity   Alcohol use: No    Alcohol/week: 0.0 standard drinks   Drug use: No   Sexual activity: Not on file  Other Topics Concern   Not on file  Social History Narrative   Not on file   Social Determinants of Health   Financial Resource Strain: Not on file  Food Insecurity: Not on file  Transportation Needs: Not on file  Physical Activity: Not on file  Stress: Not on file  Social Connections: Not on file  Intimate Partner Violence: Not on file    Outpatient Medications Prior to Visit  Medication Sig Dispense Refill   azithromycin (ZITHROMAX) 250 MG tablet Take 2 tabs today, then 1 tab daily for 4 days 1 each 0   No facility-administered medications prior to visit.  Allergies  Allergen Reactions   Imitrex [Sumatriptan] Anaphylaxis   Latex Itching   Penicillins Hives and Swelling    Review of Systems REFER TO HPI FOR PERTINENT POSITIVES AND NEGATIVES     Objective:    Physical Exam Constitutional:      Appearance: Normal appearance.  Skin:    Comments: Dry patches present left upper arm and flexor folds of arms.  Neurological:     Mental Status: She is alert.    BP 124/78   Pulse 70   Temp 98.1 F (36.7 C)   Ht 5' (1.524 m)   Wt 171 lb 3.2 oz (77.7 kg)   LMP 06/11/2014   SpO2 99%   BMI 33.44 kg/m  Wt Readings from Last 3 Encounters:  01/24/21 171 lb 3.2 oz (77.7 kg)  10/14/20 169 lb (76.7 kg)  08/11/20 172 lb (78 kg)    Health Maintenance Due  Topic Date Due   COVID-19 Vaccine (1) Never done   Pneumococcal Vaccine 70-34 Years old (1 - PCV) Never done   Zoster Vaccines- Shingrix (1 of 2) Never done   PAP SMEAR-Modifier  11/23/2017   INFLUENZA VACCINE  01/09/2021    There are no preventive care  reminders to display for this patient.   Lab Results  Component Value Date   TSH 0.796 01/16/2013   Lab Results  Component Value Date   WBC 4.9 10/14/2020   HGB 11.5 (L) 10/14/2020   HCT 35.0 (L) 10/14/2020   MCV 90.9 10/14/2020   PLT 334 10/14/2020   Lab Results  Component Value Date   NA 138 10/14/2020   K 4.0 10/14/2020   CO2 26 10/14/2020   GLUCOSE 96 10/14/2020   BUN 17 10/14/2020   CREATININE 0.71 10/14/2020   BILITOT 0.2 08/03/2016   ALKPHOS 53 08/03/2016   AST 13 08/03/2016   ALT 10 08/03/2016   PROT 7.1 08/03/2016   ALBUMIN 4.2 08/03/2016   CALCIUM 9.6 10/14/2020   ANIONGAP 9 10/14/2020   Lab Results  Component Value Date   CHOL 135 01/16/2013   Lab Results  Component Value Date   HDL 44 01/16/2013   Lab Results  Component Value Date   LDLCALC 84 01/16/2013   Lab Results  Component Value Date   TRIG 33 01/16/2013   Lab Results  Component Value Date   CHOLHDL 3.1 01/16/2013   No results found for: HGBA1C     Assessment & Plan:   Problem List Items Addressed This Visit   None Visit Diagnoses     Rash and nonspecific skin eruption    -  Primary        Meds ordered this encounter  Medications   triamcinolone cream (KENALOG) 0.1 %    Sig: Apply thin layer to affected areas twice daily as needed. Do not use consistently for more than 2 weeks at a time.    Dispense:  30 g    Refill:  0   1. Rash and nonspecific skin eruption I agree with the patient, this is looking similar in appearance to an eczema like rash.  Most likely this is secondary to the new body wash that she used.  I encouraged her to keep moisturizing twice daily and she may also use triamcinolone that I have prescribed only as directed.  She may take Claritin or Zyrtec as well.  She also needs to discontinue this body wash and resume her regular wash again.  If this does not improve or  worsens, we may need to see a dermatologist.   Crist Infante Elesia Pemberton, PA-C

## 2021-02-10 ENCOUNTER — Telehealth: Payer: Self-pay

## 2021-02-10 MED ORDER — TRIAMCINOLONE ACETONIDE 0.1 % EX CREA: TOPICAL_CREAM | CUTANEOUS | 0 refills | Status: DC

## 2021-02-10 NOTE — Telephone Encounter (Signed)
Pt called in requesting a refill. Pt stated that she has lost her prescription and wanted to know if Alyssa would call it in again. Jess stated that she needs triamcinolone cream (KENALOG) 0.1 %.

## 2021-02-10 NOTE — Telephone Encounter (Signed)
Left message on voicemail Rx sent to pharmacy as requested. 

## 2021-05-18 ENCOUNTER — Telehealth (INDEPENDENT_AMBULATORY_CARE_PROVIDER_SITE_OTHER): Payer: BC Managed Care – PPO | Admitting: Physician Assistant

## 2021-05-18 DIAGNOSIS — J0100 Acute maxillary sinusitis, unspecified: Secondary | ICD-10-CM | POA: Diagnosis not present

## 2021-05-18 MED ORDER — PREDNISONE 20 MG PO TABS: 20.0000 mg | ORAL_TABLET | Freq: Two times a day (BID) | ORAL | 0 refills | Status: AC

## 2021-05-18 MED ORDER — DOXYCYCLINE HYCLATE 100 MG PO TABS: 100.0000 mg | ORAL_TABLET | Freq: Two times a day (BID) | ORAL | 0 refills | Status: AC

## 2021-05-18 NOTE — Progress Notes (Signed)
Virtual Visit via Video Note  I connected with  [name]  on 05/18/21 at 11:30 AM EST by a video enabled telemedicine application and verified that I am speaking with the correct person using two identifiers.  Location: Patient: home Provider: [facility] conservation officer at [employer] Persons present: Patient and myself   I discussed the limitations of evaluation and management by telemedicine and the availability of in person appointments. The patient expressed understanding and agreed to proceed.   History of Present Illness:  Chief complaint: Facial / sinus pain and pressure Symptom onset: 6-7 days ago Pertinent positives: Migraine, staying congested in sinuses Pertinent negatives: SOB, cough, CP, Fever, chills, body aches, N/V/D  Treatments tried: OTC migraine medication  Vaccine status: Not UTD on COVID or flu vaccines  Sick exposure: No sick contacts  Home COVID-19 test neg on 05/17/21     Observations/Objective:   Gen: Awake, alert, no acute distress; congested sounding  Resp: Breathing is even and non-labored Psych: calm/pleasant demeanor Neuro: Alert and Oriented x 3, + facial symmetry, speech is clear.   Assessment and Plan:  1. Acute maxillary sinusitis, recurrence not specified Persistent symptoms >7 days despite conservative efforts at home. Will Rx Doxycycline and prednisone at this time, take with food. Cautioned on antibiotic use and possible side effects. Advised nasal saline, humidifier, and pushing fluids. Call if worse or no improvement.    Follow Up Instructions:    I discussed the assessment and treatment plan with the patient. The patient was provided an opportunity to ask questions and all were answered. The patient agreed with the plan and demonstrated an understanding of the instructions.   The patient was advised to call back or seek an in-person evaluation if the symptoms worsen or if the condition fails to improve as  anticipated.  Jonne Rote M Ammara Raj, PA-C

## 2021-09-22 DIAGNOSIS — H5213 Myopia, bilateral: Secondary | ICD-10-CM | POA: Diagnosis not present

## 2021-09-22 DIAGNOSIS — H524 Presbyopia: Secondary | ICD-10-CM | POA: Diagnosis not present

## 2021-09-22 DIAGNOSIS — H43813 Vitreous degeneration, bilateral: Secondary | ICD-10-CM | POA: Diagnosis not present

## 2021-09-22 DIAGNOSIS — H2513 Age-related nuclear cataract, bilateral: Secondary | ICD-10-CM | POA: Diagnosis not present

## 2021-09-26 ENCOUNTER — Ambulatory Visit: Payer: BC Managed Care – PPO | Admitting: Physician Assistant

## 2021-09-27 ENCOUNTER — Ambulatory Visit (INDEPENDENT_AMBULATORY_CARE_PROVIDER_SITE_OTHER): Payer: BC Managed Care – PPO | Admitting: Physician Assistant

## 2021-09-27 ENCOUNTER — Ambulatory Visit: Payer: BC Managed Care – PPO | Admitting: Physician Assistant

## 2021-09-27 ENCOUNTER — Encounter: Payer: Self-pay | Admitting: Physician Assistant

## 2021-09-27 VITALS — BP 133/53 | HR 74 | Temp 97.3°F | Ht 60.0 in | Wt 173.0 lb

## 2021-09-27 DIAGNOSIS — M5412 Radiculopathy, cervical region: Secondary | ICD-10-CM | POA: Diagnosis not present

## 2021-09-27 DIAGNOSIS — Z87828 Personal history of other (healed) physical injury and trauma: Secondary | ICD-10-CM

## 2021-09-27 MED ORDER — METHYLPREDNISOLONE 4 MG PO TBPK: ORAL_TABLET | ORAL | 0 refills | Status: DC
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
## 2021-09-27 NOTE — Patient Instructions (Signed)
Take medrol dose pak as directed ?NO heavy lifting ?Consider PT, but we need MRI FIRST. Someone will call to scheduled soon.  ?

## 2021-10-05 ENCOUNTER — Ambulatory Visit
Admission: RE | Admit: 2021-10-05 | Discharge: 2021-10-05 | Disposition: A | Discharge status: Home / Self Care | Payer: BC Managed Care – PPO | Source: Ambulatory Visit | Attending: Physician Assistant | Admitting: Physician Assistant

## 2021-10-05 DIAGNOSIS — R202 Paresthesia of skin: Secondary | ICD-10-CM | POA: Diagnosis not present

## 2021-10-05 DIAGNOSIS — M5412 Radiculopathy, cervical region: Secondary | ICD-10-CM

## 2021-10-05 DIAGNOSIS — M4802 Spinal stenosis, cervical region: Secondary | ICD-10-CM | POA: Diagnosis not present

## 2021-10-05 DIAGNOSIS — Z87828 Personal history of other (healed) physical injury and trauma: Secondary | ICD-10-CM

## 2021-10-06 ENCOUNTER — Other Ambulatory Visit: Payer: Self-pay

## 2021-10-06 DIAGNOSIS — M5030 Other cervical disc degeneration, unspecified cervical region: Secondary | ICD-10-CM

## 2021-10-11 ENCOUNTER — Encounter: Payer: Self-pay | Admitting: Physician Assistant

## 2021-10-11 ENCOUNTER — Ambulatory Visit (INDEPENDENT_AMBULATORY_CARE_PROVIDER_SITE_OTHER): Payer: BC Managed Care – PPO | Admitting: Physician Assistant

## 2021-10-11 VITALS — BP 154/91 | HR 75 | Temp 98.3°F | Ht 60.0 in | Wt 173.8 lb

## 2021-10-11 DIAGNOSIS — M5030 Other cervical disc degeneration, unspecified cervical region: Secondary | ICD-10-CM

## 2021-10-11 DIAGNOSIS — M5412 Radiculopathy, cervical region: Secondary | ICD-10-CM

## 2021-10-11 DIAGNOSIS — Z23 Encounter for immunization: Secondary | ICD-10-CM | POA: Diagnosis not present

## 2021-10-11 MED ORDER — MELOXICAM 15 MG PO TABS: 15.0000 mg | ORAL_TABLET | Freq: Every day | ORAL | 0 refills | Status: DC

## 2021-10-11 MED ORDER — GABAPENTIN 300 MG PO CAPS: 300.0000 mg | ORAL_CAPSULE | Freq: Every day | ORAL | 0 refills | Status: DC
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
## 2021-10-11 NOTE — Patient Instructions (Signed)
[facility] ?Address: [address] [place] [address] ?Phone: [phone] ?

## 2021-11-03 ENCOUNTER — Other Ambulatory Visit: Payer: Self-pay

## 2021-11-03 ENCOUNTER — Emergency Department (HOSPITAL_BASED_OUTPATIENT_CLINIC_OR_DEPARTMENT_OTHER): Payer: BC Managed Care – PPO

## 2021-11-03 ENCOUNTER — Encounter (HOSPITAL_BASED_OUTPATIENT_CLINIC_OR_DEPARTMENT_OTHER): Payer: Self-pay

## 2021-11-03 ENCOUNTER — Other Ambulatory Visit (HOSPITAL_BASED_OUTPATIENT_CLINIC_OR_DEPARTMENT_OTHER): Payer: Self-pay

## 2021-11-03 ENCOUNTER — Emergency Department (HOSPITAL_BASED_OUTPATIENT_CLINIC_OR_DEPARTMENT_OTHER)
Admission: EM | Admit: 2021-11-03 | Discharge: 2021-11-03 | Disposition: A | Discharge status: Home / Self Care | Payer: BC Managed Care – PPO | Attending: Emergency Medicine | Admitting: Emergency Medicine

## 2021-11-03 DIAGNOSIS — K760 Fatty (change of) liver, not elsewhere classified: Secondary | ICD-10-CM | POA: Diagnosis not present

## 2021-11-03 DIAGNOSIS — N200 Calculus of kidney: Secondary | ICD-10-CM

## 2021-11-03 DIAGNOSIS — R1032 Left lower quadrant pain: Secondary | ICD-10-CM | POA: Diagnosis not present

## 2021-11-03 DIAGNOSIS — N209 Urinary calculus, unspecified: Secondary | ICD-10-CM | POA: Diagnosis not present

## 2021-11-03 DIAGNOSIS — N132 Hydronephrosis with renal and ureteral calculous obstruction: Secondary | ICD-10-CM | POA: Insufficient documentation

## 2021-11-03 DIAGNOSIS — Z91040 Latex allergy status: Secondary | ICD-10-CM | POA: Insufficient documentation

## 2021-11-03 DIAGNOSIS — N1330 Unspecified hydronephrosis: Secondary | ICD-10-CM | POA: Diagnosis not present

## 2021-11-03 LAB — COMPREHENSIVE METABOLIC PANEL
ALT: 19 U/L (ref 0–44)
AST: 22 U/L (ref 15–41)
Albumin: 3.8 g/dL (ref 3.5–5.0)
Alkaline Phosphatase: 44 U/L (ref 38–126)
Anion gap: 4 — ABNORMAL LOW (ref 5–15)
BUN: 17 mg/dL (ref 6–20)
CO2: 25 mmol/L (ref 22–32)
Calcium: 9.1 mg/dL (ref 8.9–10.3)
Chloride: 108 mmol/L (ref 98–111)
Creatinine, Ser: 0.82 mg/dL (ref 0.44–1.00)
GFR, Estimated: >60 mL/min (ref >60)
Glucose, Bld: 155 mg/dL — ABNORMAL HIGH (ref 70–99)
Potassium: 3.3 mmol/L — ABNORMAL LOW (ref 3.5–5.1)
Sodium: 137 mmol/L (ref 135–145)
Total Bilirubin: 0.5 mg/dL (ref 0.3–1.2)
Total Protein: 7.2 g/dL (ref 6.5–8.1)

## 2021-11-03 LAB — URINALYSIS, ROUTINE W REFLEX MICROSCOPIC
Bilirubin Urine: NEGATIVE
Glucose, UA: NEGATIVE mg/dL
Ketones, ur: NEGATIVE mg/dL
Leukocytes,Ua: NEGATIVE
Nitrite: NEGATIVE
Protein, ur: NEGATIVE mg/dL
Specific Gravity, Urine: 1.025 (ref 1.005–1.030)
pH: 7 (ref 5.0–8.0)

## 2021-11-03 LAB — CBC
HCT: 35 % — ABNORMAL LOW (ref 36.0–46.0)
Hemoglobin: 11.6 g/dL — ABNORMAL LOW (ref 12.0–15.0)
MCH: 29.4 pg (ref 26.0–34.0)
MCHC: 33.1 g/dL (ref 30.0–36.0)
MCV: 88.6 fL (ref 80.0–100.0)
Platelets: 324 10*3/uL (ref 150–400)
RBC: 3.95 MIL/uL (ref 3.87–5.11)
RDW: 14.6 % (ref 11.5–15.5)
WBC: 9.8 10*3/uL (ref 4.0–10.5)
nRBC: 0 % (ref 0.0–0.2)

## 2021-11-03 LAB — URINALYSIS, MICROSCOPIC (REFLEX)
Bacteria, UA: NONE SEEN
WBC, UA: NONE SEEN WBC/hpf (ref 0–5)

## 2021-11-03 LAB — LIPASE, BLOOD: Lipase: 21 U/L (ref 11–51)

## 2021-11-03 MED ORDER — KETOROLAC TROMETHAMINE 10 MG PO TABS
10.0000 mg | ORAL_TABLET | Freq: Four times a day (QID) | ORAL | 0 refills | Status: DC | PRN
  Filled 2021-11-03: qty 20, 5d supply, fill #0

## 2021-11-03 MED ORDER — TAMSULOSIN HCL 0.4 MG PO CAPS
0.4000 mg | ORAL_CAPSULE | Freq: Every day | ORAL | 0 refills | Status: AC
  Filled 2021-11-03: qty 7, 7d supply, fill #0

## 2021-11-03 MED ORDER — OXYCODONE-ACETAMINOPHEN 5-325 MG PO TABS
1.0000 | ORAL_TABLET | Freq: Four times a day (QID) | ORAL | 0 refills | Status: DC | PRN
  Filled 2021-11-03: qty 15, 4d supply, fill #0

## 2021-11-03 MED ORDER — KETOROLAC TROMETHAMINE 15 MG/ML IJ SOLN
15.0000 mg | Freq: Once | INTRAMUSCULAR | Status: AC
  Administered 2021-11-03: 15 mg via INTRAVENOUS
  Filled 2021-11-03: qty 1

## 2021-11-03 MED ORDER — METOCLOPRAMIDE HCL 5 MG/ML IJ SOLN
10.0000 mg | Freq: Once | INTRAMUSCULAR | Status: AC
  Administered 2021-11-03: 10 mg via INTRAVENOUS
  Filled 2021-11-03: qty 2

## 2021-11-03 MED ORDER — HYDROMORPHONE HCL 1 MG/ML IJ SOLN
0.5000 mg | Freq: Once | INTRAMUSCULAR | Status: AC
  Administered 2021-11-03: 0.5 mg via INTRAVENOUS
  Filled 2021-11-03: qty 1

## 2021-11-03 MED ORDER — SODIUM CHLORIDE 0.9 % IV BOLUS
1000.0000 mL | Freq: Once | INTRAVENOUS | Status: AC
  Administered 2021-11-03: 1000 mL via INTRAVENOUS

## 2021-11-03 MED ORDER — ONDANSETRON 4 MG PO TBDP
4.0000 mg | ORAL_TABLET | Freq: Three times a day (TID) | ORAL | 0 refills | Status: DC | PRN
  Filled 2021-11-03: qty 20, 7d supply, fill #0

## 2021-11-03 MED ORDER — ONDANSETRON HCL 4 MG/2ML IJ SOLN
4.0000 mg | Freq: Once | INTRAMUSCULAR | Status: AC
  Administered 2021-11-03: 4 mg via INTRAVENOUS
  Filled 2021-11-03: qty 2

## 2021-11-03 NOTE — ED Notes (Signed)
Pt aware of urine sample will let staff know when she can void.

## 2021-11-03 NOTE — ED Notes (Signed)
Ambulatory to BR, back to room. C/o of nausea and left flank pain . ED MD informed

## 2021-11-03 NOTE — ED Triage Notes (Signed)
Patient here POV from Home.  Endorses Left Sided Flank Pain since this AM that awoke her associated with N/V one Hour Later.   Pain Radiates to ABD Area. No Urinary Symptoms.   Actively Vomiting in Triage. A&Ox4. GCS 15. Ambulatory.

## 2021-11-03 NOTE — ED Provider Notes (Signed)
[facility] EMERGENCY DEPARTMENT Provider Note   CSN: [id number] Arrival date & time: 11/03/21  1049     History  Chief Complaint  Patient presents with   Flank Pain    [name] is a 61 y.o. female.  Patient presents with sudden onset left flank pain since 7 AM today.  Describes pain as sharp and persistent rating down to her left groin.  No associated fevers or cough no associated diarrhea.  Patient has episodes of vomiting due to pain she states that over the last 10 hours.  Nonbloody vomitus described.      Home Medications Prior to Admission medications   Medication Sig Start Date End Date Taking? Authorizing Provider  gabapentin (NEURONTIN) 300 MG capsule Take 1 capsule (300 mg total) by mouth at bedtime. 10/11/21 11/10/21  Allwardt, Crist Infante, PA-C  meloxicam (MOBIC) 15 MG tablet Take 1 tablet (15 mg total) by mouth daily. Do not take any other NSAIDs with this medication (Aleve, Ibuprofen, Motrin, etc). 10/11/21   Allwardt, Alyssa M, PA-C  methylPREDNISolone (MEDROL DOSEPAK) 4 MG TBPK tablet Please take per packaging instructions. Patient not taking: Reported on 10/11/2021 09/27/21   Allwardt, Crist Infante, PA-C  triamcinolone cream (KENALOG) 0.1 % Apply thin layer to affected areas twice daily as needed. Do not use consistently for more than 2 weeks at a time. 02/10/21   Allwardt, Crist Infante, PA-C      Allergies    Imitrex [sumatriptan], Latex, and Penicillins    Review of Systems   Review of Systems  Constitutional:  Negative for fever.  HENT:  Negative for ear pain.   Eyes:  Negative for pain.  Respiratory:  Negative for cough.   Cardiovascular:  Negative for chest pain.  Gastrointestinal:  Negative for abdominal pain.  Genitourinary:  Positive for flank pain.  Musculoskeletal:  Negative for back pain.  Skin:  Negative for rash.  Neurological:  Negative for headaches.   Physical Exam Updated Vital Signs BP (!) 132/56 (BP Location: Left Arm)   Pulse (!) 57    Temp 97.6 F (36.4 C) (Oral)   Resp 18   Ht 5' (1.524 m)   Wt 78.8 kg   LMP 06/11/2014   SpO2 100%   BMI 33.93 kg/m  Physical Exam Constitutional:      General: She is not in acute distress.    Appearance: Normal appearance.  HENT:     Head: Normocephalic.     Nose: Nose normal.  Eyes:     Extraocular Movements: Extraocular movements intact.  Cardiovascular:     Rate and Rhythm: Normal rate.  Pulmonary:     Effort: Pulmonary effort is normal.  Abdominal:     Tenderness: There is no abdominal tenderness. There is no guarding or rebound.  Musculoskeletal:        General: Normal range of motion.     Cervical back: Normal range of motion.  Neurological:     General: No focal deficit present.     Mental Status: She is alert. Mental status is at baseline.    ED Results / Procedures / Treatments   Labs (all labs ordered are listed, but only abnormal results are displayed) Labs Reviewed  COMPREHENSIVE METABOLIC PANEL - Abnormal; Notable for the following components:      Result Value   Potassium 3.3 (*)    Glucose, Bld 155 (*)    Anion gap 4 (*)    All other components within normal limits  CBC -  Abnormal; Notable for the following components:   Hemoglobin 11.6 (*)    HCT 35.0 (*)    All other components within normal limits  URINALYSIS, ROUTINE W REFLEX MICROSCOPIC - Abnormal; Notable for the following components:   Hgb urine dipstick MODERATE (*)    All other components within normal limits  LIPASE, BLOOD  URINALYSIS, MICROSCOPIC (REFLEX)    EKG None  Radiology CT Renal Stone Study  Result Date: 11/03/2021 CLINICAL DATA:  Left flank pain, nausea and vomiting EXAM: CT ABDOMEN AND PELVIS WITHOUT CONTRAST TECHNIQUE: Multidetector CT imaging of the abdomen and pelvis was performed following the standard protocol without IV contrast. RADIATION DOSE REDUCTION: This exam was performed according to the departmental dose-optimization program which includes automated  exposure control, adjustment of the mA and/or kV according to patient size and/or use of iterative reconstruction technique. COMPARISON:  None Available. FINDINGS: Lower chest: No acute abnormality. Hepatobiliary: Hepatic steatosis. Multiple rounded fluid attenuation lesions throughout the liver, likely cysts or hemangiomata. No gallstones, gallbladder wall thickening, or biliary dilatation. Pancreas: Unremarkable. No pancreatic ductal dilatation or surrounding inflammatory changes. Spleen: Normal in size without significant abnormality. Adrenals/Urinary Tract: Adrenal glands are unremarkable. There is a 0.5 cm calculus at the left ureteropelvic junction with moderate left hydronephrosis. Medullary nephrocalcinosis without other discrete calculi. Bladder is unremarkable. Stomach/Bowel: Stomach is within normal limits. Appendix appears normal. No evidence of bowel wall thickening, distention, or inflammatory changes. Note of high density ingested tablet material within loops of distal small bowel in the low pelvis (series 2, image 64). Vascular/Lymphatic: No significant vascular findings are present. No enlarged abdominal or pelvic lymph nodes. Reproductive: No mass or other significant abnormality. Other: No abdominal wall hernia or abnormality. No ascites. Musculoskeletal: No acute or significant osseous findings. IMPRESSION: 1. There is a 0.5 cm calculus at the left ureteropelvic junction with moderate left hydronephrosis. 2. Medullary nephrocalcinosis without other discrete calculi. 3. Hepatic steatosis. 4. Multiple rounded fluid attenuation lesions throughout the liver, likely cysts or hemangiomata, however incompletely characterized by noncontrast CT. Electronically Signed   By: Jearld Lesch [name]   On: 11/03/2021 11:47    Procedures Procedures    Medications Ordered in ED Medications  HYDROmorphone (DILAUDID) injection 0.5 mg (has no administration in time range)  ketorolac (TORADOL) 15 MG/ML  injection 15 mg (15 mg Intravenous Given 11/03/21 1140)  sodium chloride 0.9 % bolus 1,000 mL (0 mLs Intravenous Stopped 11/03/21 1237)  HYDROmorphone (DILAUDID) injection 0.5 mg (0.5 mg Intravenous Given 11/03/21 1141)  ondansetron (ZOFRAN) injection 4 mg (4 mg Intravenous Given 11/03/21 1141)  ketorolac (TORADOL) 15 MG/ML injection 15 mg (15 mg Intravenous Given 11/03/21 1305)  metoCLOPramide (REGLAN) injection 10 mg (10 mg Intravenous Given 11/03/21 1306)    ED Course/ Medical Decision Making/ A&P                           Medical Decision Making Amount and/or Complexity of Data Reviewed Labs: ordered. Radiology: ordered.  Risk Prescription drug management.   Chart review shows office visit for cervical radiculopathy Oct 11, 2021.  Cardiac monitoring shows sinus rhythm.  Diagnostic studies included labs, white count 10 hemoglobin 11 chemistry unremarkable.  Urinalysis showing no signs of infection.  CT on pelvis concerning for kidney stone with moderate hydronephrosis.  Patient given IV fluid resuscitation and Toradol and Dilaudid with improvement of symptoms.  Advised outpatient follow-up with urology this week.  Advising immediate return for fevers worsening pain or  any additional concerns.         Final Clinical Impression(s) / ED Diagnoses Final diagnoses:  Kidney stone    Rx / DC Orders ED Discharge Orders     None         Cheryll CockayneHong, Kaley Jutras S, MD 11/03/21 1328

## 2021-11-03 NOTE — ED Notes (Signed)
ED Provider at bedside. 

## 2021-11-03 NOTE — Discharge Instructions (Signed)
Follow-up with urology this week.  You will need to call to make an appointment.  Return back to the ER if you have fevers worsening symptoms or any additional concerns.

## 2021-11-03 NOTE — ED Notes (Signed)
Patient transported to CT 

## 2021-11-08 ENCOUNTER — Other Ambulatory Visit (HOSPITAL_BASED_OUTPATIENT_CLINIC_OR_DEPARTMENT_OTHER): Payer: Self-pay

## 2021-11-14 DIAGNOSIS — N201 Calculus of ureter: Secondary | ICD-10-CM | POA: Diagnosis not present

## 2021-11-23 DIAGNOSIS — N201 Calculus of ureter: Secondary | ICD-10-CM | POA: Diagnosis not present

## 2021-11-26 ENCOUNTER — Encounter (HOSPITAL_BASED_OUTPATIENT_CLINIC_OR_DEPARTMENT_OTHER): Payer: Self-pay | Admitting: Emergency Medicine

## 2021-11-26 ENCOUNTER — Emergency Department (HOSPITAL_BASED_OUTPATIENT_CLINIC_OR_DEPARTMENT_OTHER): Payer: BC Managed Care – PPO

## 2021-11-26 ENCOUNTER — Emergency Department (HOSPITAL_BASED_OUTPATIENT_CLINIC_OR_DEPARTMENT_OTHER)
Admission: EM | Admit: 2021-11-26 | Discharge: 2021-11-27 | Disposition: A | Discharge status: Home / Self Care | Payer: BC Managed Care – PPO | Attending: Emergency Medicine | Admitting: Emergency Medicine

## 2021-11-26 DIAGNOSIS — Z91040 Latex allergy status: Secondary | ICD-10-CM | POA: Diagnosis not present

## 2021-11-26 DIAGNOSIS — K769 Liver disease, unspecified: Secondary | ICD-10-CM | POA: Diagnosis not present

## 2021-11-26 DIAGNOSIS — R339 Retention of urine, unspecified: Secondary | ICD-10-CM | POA: Insufficient documentation

## 2021-11-26 DIAGNOSIS — N201 Calculus of ureter: Secondary | ICD-10-CM | POA: Diagnosis not present

## 2021-11-26 DIAGNOSIS — K5900 Constipation, unspecified: Secondary | ICD-10-CM

## 2021-11-26 DIAGNOSIS — K5641 Fecal impaction: Secondary | ICD-10-CM | POA: Insufficient documentation

## 2021-11-26 DIAGNOSIS — N1330 Unspecified hydronephrosis: Secondary | ICD-10-CM | POA: Diagnosis not present

## 2021-11-26 DIAGNOSIS — N200 Calculus of kidney: Secondary | ICD-10-CM | POA: Diagnosis not present

## 2021-11-26 LAB — CBC WITH DIFFERENTIAL/PLATELET
Abs Immature Granulocytes: 0.03 10*3/uL (ref 0.00–0.07)
Basophils Absolute: 0 10*3/uL (ref 0.0–0.1)
Basophils Relative: 0 %
Eosinophils Absolute: 0 10*3/uL (ref 0.0–0.5)
Eosinophils Relative: 0 %
HCT: 33.3 % — ABNORMAL LOW (ref 36.0–46.0)
Hemoglobin: 11.2 g/dL — ABNORMAL LOW (ref 12.0–15.0)
Immature Granulocytes: 0 %
Lymphocytes Relative: 11 %
Lymphs Abs: 1 10*3/uL (ref 0.7–4.0)
MCH: 30.1 pg (ref 26.0–34.0)
MCHC: 33.6 g/dL (ref 30.0–36.0)
MCV: 89.5 fL (ref 80.0–100.0)
Monocytes Absolute: 0.6 10*3/uL (ref 0.1–1.0)
Monocytes Relative: 6 %
Neutro Abs: 7.4 10*3/uL (ref 1.7–7.7)
Neutrophils Relative %: 83 %
Platelets: 367 10*3/uL (ref 150–400)
RBC: 3.72 MIL/uL — ABNORMAL LOW (ref 3.87–5.11)
RDW: 14.7 % (ref 11.5–15.5)
WBC: 9.1 10*3/uL (ref 4.0–10.5)
nRBC: 0 % (ref 0.0–0.2)

## 2021-11-26 LAB — COMPREHENSIVE METABOLIC PANEL
ALT: 21 U/L (ref 0–44)
AST: 22 U/L (ref 15–41)
Albumin: 3.9 g/dL (ref 3.5–5.0)
Alkaline Phosphatase: 42 U/L (ref 38–126)
Anion gap: 6 (ref 5–15)
BUN: 17 mg/dL (ref 6–20)
CO2: 27 mmol/L (ref 22–32)
Calcium: 9.9 mg/dL (ref 8.9–10.3)
Chloride: 105 mmol/L (ref 98–111)
Creatinine, Ser: 0.9 mg/dL (ref 0.44–1.00)
GFR, Estimated: >60 mL/min (ref >60)
Glucose, Bld: 126 mg/dL — ABNORMAL HIGH (ref 70–99)
Potassium: 3.7 mmol/L (ref 3.5–5.1)
Sodium: 138 mmol/L (ref 135–145)
Total Bilirubin: 0.5 mg/dL (ref 0.3–1.2)
Total Protein: 7.7 g/dL (ref 6.5–8.1)

## 2021-11-26 LAB — LIPASE, BLOOD: Lipase: 23 U/L (ref 11–51)

## 2021-11-26 MED ORDER — FENTANYL CITRATE PF 50 MCG/ML IJ SOSY
50.0000 ug | PREFILLED_SYRINGE | Freq: Once | INTRAMUSCULAR | Status: AC
  Administered 2021-11-26: 50 ug via INTRAVENOUS
  Filled 2021-11-26: qty 1

## 2021-11-26 NOTE — ED Provider Notes (Incomplete)
[facility] EMERGENCY DEPARTMENT Provider Note   CSN: [id number] Arrival date & time: 11/26/21  2155     History {Add pertinent medical, surgical, social history, OB history to HPI:1} Chief Complaint  Patient presents with  . Constipation    [name] is a 61 y.o. female.  Patient presents complaining of constipation and inability to urinate since noon today.  Patient constipation has been ongoing and the patient is unsure of last bowel movement.  Patient states she has tried prune juice, protein shakes, and other home remedies trying to have a bowel movement.  Patient feels that there is a piece of fecal matter stuck that she is unable to pass.  The patient states that she feels that she has to urinate and has been unable to do so since noon today.  History of constipation, UTIs, gallstones, anemia, ovarian cysts  HPI     Home Medications Prior to Admission medications   Medication Sig Start Date End Date Taking? Authorizing Provider  gabapentin (NEURONTIN) 300 MG capsule Take 1 capsule (300 mg total) by mouth at bedtime. 10/11/21 11/10/21  Allwardt, Crist Infante, PA-C  ketorolac (TORADOL) 10 MG tablet Take 1 tablet (10 mg total) by mouth every 6 (six) hours as needed. 11/03/21   Cheryll Cockayne, MD  meloxicam (MOBIC) 15 MG tablet Take 1 tablet (15 mg total) by mouth daily. Do not take any other NSAIDs with this medication (Aleve, Ibuprofen, Motrin, etc). 10/11/21   Allwardt, Alyssa M, PA-C  methylPREDNISolone (MEDROL DOSEPAK) 4 MG TBPK tablet Please take per packaging instructions. Patient not taking: Reported on 10/11/2021 09/27/21   Allwardt, Alyssa M, PA-C  ondansetron (ZOFRAN-ODT) 4 MG disintegrating tablet Take 1 tablet (4 mg total) by mouth every 8 (eight) hours as needed for nausea or vomiting. 11/03/21   Cheryll Cockayne, MD  oxyCODONE-acetaminophen (PERCOCET/ROXICET) 5-325 MG tablet Take 1 tablet by mouth every 6 (six) hours as needed for severe pain. 11/03/21   Cheryll Cockayne, MD   triamcinolone cream (KENALOG) 0.1 % Apply thin layer to affected areas twice daily as needed. Do not use consistently for more than 2 weeks at a time. 02/10/21   Allwardt, Crist Infante, PA-C      Allergies    Imitrex [sumatriptan], Latex, and Penicillins    Review of Systems   Review of Systems  Gastrointestinal:  Positive for constipation. Negative for abdominal pain, nausea and vomiting.  Genitourinary:  Positive for difficulty urinating.    Physical Exam Updated Vital Signs LMP 06/11/2014  Physical Exam Vitals and nursing note reviewed. Exam conducted with a chaperone present.  Constitutional:      Appearance: She is ill-appearing.  HENT:     Head: Normocephalic and atraumatic.     Mouth/Throat:     Mouth: Mucous membranes are moist.  Eyes:     Conjunctiva/sclera: Conjunctivae normal.  Cardiovascular:     Rate and Rhythm: Normal rate and regular rhythm.     Pulses: Normal pulses.  Pulmonary:     Effort: Pulmonary effort is normal.     Breath sounds: Normal breath sounds.  Abdominal:     General: There is no distension.     Palpations: Abdomen is soft.     Tenderness: There is no abdominal tenderness.  Genitourinary:    Comments: Significant fecal impaction Musculoskeletal:     Cervical back: Normal range of motion.  Neurological:     Mental Status: She is alert.     ED Results / Procedures /  Treatments   Labs (all labs ordered are listed, but only abnormal results are displayed) Labs Reviewed  CBC WITH DIFFERENTIAL/PLATELET  COMPREHENSIVE METABOLIC PANEL  LIPASE, BLOOD  URINALYSIS, ROUTINE W REFLEX MICROSCOPIC    EKG None  Radiology No results found.  Procedures Fecal disimpaction  Date/Time: 11/26/2021 11:58 PM  Performed by: Darrick Grinder, PA-C Authorized by: Pamala Duffel  Consent: Verbal consent obtained. Consent given by: patient Patient understanding: patient states understanding of the procedure being performed Patient consent:  the patient's understanding of the procedure matches consent given Procedure consent: procedure consent matches procedure scheduled Relevant documents: relevant documents present and verified Test results: test results available and properly labeled Patient identity confirmed: verbally with patient Local anesthesia used: no  Anesthesia: Local anesthesia used: no  Sedation: Patient sedated: no  Patient tolerance: patient tolerated the procedure well with no immediate complications Comments: Significant amount of feces disimpacted from rectal vault     {Document cardiac monitor, telemetry assessment procedure when appropriate:1}  Medications Ordered in ED Medications - No data to display  ED Course/ Medical Decision Making/ A&P                           Medical Decision Making Amount and/or Complexity of Data Reviewed Labs: ordered. Radiology: ordered.   This patient presents to the ED for concern of constipation and urinary retention, this involves an extensive number of treatment options, and is a complaint that carries with it a high risk of complications and morbidity.  The differential diagnosis includes bowel obstruction, functional constipation, and others   Co morbidities that complicate the patient evaluation  Hx of urinary retention and constipation   Additional history obtained:   External records from outside source obtained and reviewed including records showing previous visits for same complaints   Lab Tests:  I Ordered, and personally interpreted labs.  The pertinent results include: Hemoglobin 11.2, glucose 126, lipase 23   Imaging Studies ordered:  I ordered imaging studies including CT abdomen pelvis I independently visualized and interpreted imaging which showed  1. Interval distal migration of the previously seen 6 mm left UPJ calculus, and now located in the distal left ureter. Interval improvement of the left-sided hydronephrosis since the  prior CT. 2. Tiny nonobstructing right renal calculi. 3. Fatty liver. 4. Colonic diverticulosis. No bowel obstruction. Normal appendix  I agree with the radiologist interpretation    Problem List / ED Course / Critical interventions / Medication management   I ordered medication including fentanyl for pain  Reevaluation of the patient after these medicines showed that the patient improved I have reviewed the patients home medicines and have made adjustments as needed   Test / Admission - Considered:  The patient had significant fecal impaction on rectal exam and was disimpacted. Unclear cause of constipation.  Patient attempted urination after disimpaction.  Patient aware of stone noted on CT. Currently followed by urology for same. No concerns of infected stone at this time.   {Document critical care time when appropriate:1} {Document review of labs and clinical decision tools ie heart score, Chads2Vasc2 etc:1}  {Document your independent review of radiology images, and any outside records:1} {Document your discussion with family members, caretakers, and with consultants:1} {Document social determinants of health affecting pt's care:1} {Document your decision making why or why not admission, treatments were needed:1} Final Clinical Impression(s) / ED Diagnoses Final diagnoses:  None    Rx / DC Orders  ED Discharge Orders     None

## 2021-11-26 NOTE — ED Triage Notes (Signed)
Pt reports constipation; unsure when her last BM was; also states she can't urinate since 12pm

## 2021-11-26 NOTE — ED Provider Notes (Signed)
[facility] EMERGENCY DEPARTMENT Provider Note   CSN: [id number] Arrival date & time: 11/26/21  2155     History  Chief Complaint  Patient presents with   Constipation    [name] is a 61 y.o. female.  Patient presents complaining of constipation and inability to urinate since noon today.  Patient constipation has been ongoing and the patient is unsure of last bowel movement.  Patient states she has tried prune juice, protein shakes, and other home remedies trying to have a bowel movement.  Patient feels that there is a piece of fecal matter stuck that she is unable to pass.  The patient states that she feels that she has to urinate and has been unable to do so since noon today.  History of constipation, UTIs, gallstones, anemia, ovarian cysts  HPI     Home Medications Prior to Admission medications   Medication Sig Start Date End Date Taking? Authorizing Provider  gabapentin (NEURONTIN) 300 MG capsule Take 1 capsule (300 mg total) by mouth at bedtime. 10/11/21 11/10/21  Allwardt, Crist Infante, PA-C  ketorolac (TORADOL) 10 MG tablet Take 1 tablet (10 mg total) by mouth every 6 (six) hours as needed. 11/03/21   Cheryll Cockayne, MD  meloxicam (MOBIC) 15 MG tablet Take 1 tablet (15 mg total) by mouth daily. Do not take any other NSAIDs with this medication (Aleve, Ibuprofen, Motrin, etc). 10/11/21   Allwardt, Alyssa M, PA-C  methylPREDNISolone (MEDROL DOSEPAK) 4 MG TBPK tablet Please take per packaging instructions. Patient not taking: Reported on 10/11/2021 09/27/21   Allwardt, Alyssa M, PA-C  ondansetron (ZOFRAN-ODT) 4 MG disintegrating tablet Take 1 tablet (4 mg total) by mouth every 8 (eight) hours as needed for nausea or vomiting. 11/03/21   Cheryll Cockayne, MD  oxyCODONE-acetaminophen (PERCOCET/ROXICET) 5-325 MG tablet Take 1 tablet by mouth every 6 (six) hours as needed for severe pain. 11/03/21   Cheryll Cockayne, MD  triamcinolone cream (KENALOG) 0.1 % Apply thin layer to affected  areas twice daily as needed. Do not use consistently for more than 2 weeks at a time. 02/10/21   Allwardt, Crist Infante, PA-C      Allergies    Imitrex [sumatriptan], Latex, and Penicillins    Review of Systems   Review of Systems  Gastrointestinal:  Positive for constipation. Negative for abdominal pain, nausea and vomiting.  Genitourinary:  Positive for difficulty urinating.    Physical Exam Updated Vital Signs BP 131/64   Pulse 72   Temp 97.9 F (36.6 C)   Resp 18   LMP 06/11/2014   SpO2 97%  Physical Exam Vitals and nursing note reviewed. Exam conducted with a chaperone present.  Constitutional:      Appearance: She is ill-appearing.  HENT:     Head: Normocephalic and atraumatic.     Mouth/Throat:     Mouth: Mucous membranes are moist.  Eyes:     Conjunctiva/sclera: Conjunctivae normal.  Cardiovascular:     Rate and Rhythm: Normal rate and regular rhythm.     Pulses: Normal pulses.  Pulmonary:     Effort: Pulmonary effort is normal.     Breath sounds: Normal breath sounds.  Abdominal:     General: There is no distension.     Palpations: Abdomen is soft.     Tenderness: There is no abdominal tenderness.  Genitourinary:    Comments: Significant fecal impaction Musculoskeletal:     Cervical back: Normal range of motion.  Neurological:  Mental Status: She is alert.     ED Results / Procedures / Treatments   Labs (all labs ordered are listed, but only abnormal results are displayed) Labs Reviewed  CBC WITH DIFFERENTIAL/PLATELET - Abnormal; Notable for the following components:      Result Value   RBC 3.72 (*)    Hemoglobin 11.2 (*)    HCT 33.3 (*)    All other components within normal limits  COMPREHENSIVE METABOLIC PANEL - Abnormal; Notable for the following components:   Glucose, Bld 126 (*)    All other components within normal limits  LIPASE, BLOOD  URINALYSIS, ROUTINE W REFLEX MICROSCOPIC    EKG None  Radiology CT Abdomen Pelvis Wo  Contrast  Result Date: 11/26/2021 CLINICAL DATA:  Abdominal pain. EXAM: CT ABDOMEN AND PELVIS WITHOUT CONTRAST TECHNIQUE: Multidetector CT imaging of the abdomen and pelvis was performed following the standard protocol without IV contrast. RADIATION DOSE REDUCTION: This exam was performed according to the departmental dose-optimization program which includes automated exposure control, adjustment of the mA and/or kV according to patient size and/or use of iterative reconstruction technique. COMPARISON:  CT abdomen pelvis dated 11/03/2021. FINDINGS: Evaluation of this exam is limited in the absence of intravenous contrast. Lower chest: The visualized lung bases are clear. No intra-abdominal free air or free fluid. Hepatobiliary: Fatty liver. Multiple hypodense liver lesions measure up to 2.5 cm in the dome of the liver and seen on the prior CT. These lesions are suboptimally characterized on this noncontrast CT but appears similar to prior CT, possibly cysts or hemangioma. Ultrasound or MRI may provide better characterization on a nonemergent/outpatient basis if clinically indicated. No biliary ductal dilatation. The gallbladder is unremarkable. Pancreas: Unremarkable. No pancreatic ductal dilatation or surrounding inflammatory changes. Spleen: Normal in size without focal abnormality. Adrenals/Urinary Tract: The adrenal glands unremarkable. Several tiny nonobstructing right renal calculi measure up to 2 mm. No hydronephrosis or obstructing stone. There is a right extrarenal pelvis with mild pelviectasis. The previously seen 6 mm stone at the left UPJ has migrated distally and now located in the distal left ureter. There is associated mild left hydronephrosis, improved since the prior CT. There is an extrarenal pelvis on the left with mild pelviectasis. No stone identified in the left kidney. The urinary bladder is grossly unremarkable. Stomach/Bowel: There is moderate stool in the rectal vault. Several scattered  distal colonic diverticula without active inflammatory changes. There is no bowel obstruction. The appendix is normal. Vascular/Lymphatic: The abdominal aorta and IVC are grossly unremarkable on this noncontrast CT. No portal venous gas. There is no adenopathy. Reproductive: The uterus and ovaries are grossly unremarkable. No pelvic mass. Other: Small fat containing umbilical hernia. Musculoskeletal: Multilevel facet arthropathy. Grade 1 L4-L5 anterolisthesis. No acute osseous pathology. IMPRESSION: 1. Interval distal migration of the previously seen 6 mm left UPJ calculus, and now located in the distal left ureter. Interval improvement of the left-sided hydronephrosis since the prior CT. 2. Tiny nonobstructing right renal calculi. 3. Fatty liver. 4. Colonic diverticulosis. No bowel obstruction. Normal appendix. Electronically Signed   By: Elgie Collard [name]   On: 11/26/2021 23:31    Procedures Fecal disimpaction  Date/Time: 11/26/2021 11:58 PM  Performed by: Darrick Grinder, PA-C Authorized by: Pamala Duffel  Consent: Verbal consent obtained. Consent given by: patient Patient understanding: patient states understanding of the procedure being performed Patient consent: the patient's understanding of the procedure matches consent given Procedure consent: procedure consent matches procedure scheduled Relevant documents: relevant  documents present and verified Test results: test results available and properly labeled Patient identity confirmed: verbally with patient Local anesthesia used: no  Anesthesia: Local anesthesia used: no  Sedation: Patient sedated: no  Patient tolerance: patient tolerated the procedure well with no immediate complications Comments: Significant amount of feces disimpacted from rectal vault       Medications Ordered in ED Medications  fentaNYL (SUBLIMAZE) injection 50 mcg (50 mcg Intravenous Given 11/26/21 2326)    ED Course/ Medical Decision  Making/ A&P                           Medical Decision Making Amount and/or Complexity of Data Reviewed Labs: ordered. Radiology: ordered.   This patient presents to the ED for concern of constipation and urinary retention, this involves an extensive number of treatment options, and is a complaint that carries with it a high risk of complications and morbidity.  The differential diagnosis includes bowel obstruction, functional constipation, and others   Co morbidities that complicate the patient evaluation  Hx of urinary retention and constipation   Additional history obtained:   External records from outside source obtained and reviewed including records showing previous visits for same complaints   Lab Tests:  I Ordered, and personally interpreted labs.  The pertinent results include: Hemoglobin 11.2, glucose 126, lipase 23   Imaging Studies ordered:  I ordered imaging studies including CT abdomen pelvis I independently visualized and interpreted imaging which showed  1. Interval distal migration of the previously seen 6 mm left UPJ calculus, and now located in the distal left ureter. Interval improvement of the left-sided hydronephrosis since the prior CT. 2. Tiny nonobstructing right renal calculi. 3. Fatty liver. 4. Colonic diverticulosis. No bowel obstruction. Normal appendix  I agree with the radiologist interpretation    Problem List / ED Course / Critical interventions / Medication management   I ordered medication including fentanyl for pain  Reevaluation of the patient after these medicines showed that the patient improved I have reviewed the patients home medicines and have made adjustments as needed   Test / Admission - Considered:  The patient had significant fecal impaction on rectal exam and was disimpacted.  Constipation likely due to recent opiate use for stone  patient attempted urination after disimpaction. Patient had significant bowel  movement but very little urine. Foley placed.  Patient aware of stone noted on CT. Currently followed by urology for same. No concerns of infected stone at this time.  Patient will discharge home and use MiraLAX.  I recommend that she began to take Dulcolax and senna while taking her opiate prescriptions.  She will follow-up with urology for Foley removal.        Final Clinical Impression(s) / ED Diagnoses Final diagnoses:  Constipation, unspecified constipation type  Fecal impaction [facility])  Urinary retention    Rx / DC Orders ED Discharge Orders     None         Ronny Bacon 11/27/21 0019    Lennice Sites, DO 11/27/21 [phone]

## 2021-11-27 NOTE — Discharge Instructions (Addendum)
You were seen today for constipation. This is likely related to your medications. I recommend taking 4 dosages of miralax with large amounts of water. I recommend taking dulcolax and senna after you clear the current constipation. Please follow up with your primary care provider for further management of your constipation. A foley was placed due to your urinary retention. Follow up with your urologist tomorrow to arrange removal.

## 2021-11-30 ENCOUNTER — Other Ambulatory Visit: Payer: Self-pay | Admitting: Urology

## 2021-11-30 DIAGNOSIS — N201 Calculus of ureter: Secondary | ICD-10-CM | POA: Diagnosis not present

## 2021-12-01 NOTE — Progress Notes (Signed)
Talked with patient. Instructions given.Arrival time 0800. Clear liquids until  0600. Meds and hx reviewed. Driver is secure

## 2021-12-04 ENCOUNTER — Encounter (HOSPITAL_BASED_OUTPATIENT_CLINIC_OR_DEPARTMENT_OTHER): Payer: Self-pay | Admitting: Urology

## 2021-12-04 ENCOUNTER — Encounter (HOSPITAL_BASED_OUTPATIENT_CLINIC_OR_DEPARTMENT_OTHER)
Admission: RE | Disposition: A | Discharge status: Home / Self Care | Payer: Self-pay | Source: Home / Self Care | Attending: Urology

## 2021-12-04 ENCOUNTER — Ambulatory Visit (HOSPITAL_COMMUNITY): Payer: BC Managed Care – PPO

## 2021-12-04 ENCOUNTER — Ambulatory Visit (HOSPITAL_BASED_OUTPATIENT_CLINIC_OR_DEPARTMENT_OTHER)
Admission: RE | Admit: 2021-12-04 | Discharge: 2021-12-04 | Disposition: A | Discharge status: Home / Self Care | Payer: BC Managed Care – PPO | Attending: Urology | Admitting: Urology

## 2021-12-04 ENCOUNTER — Other Ambulatory Visit: Payer: Self-pay

## 2021-12-04 DIAGNOSIS — Z01818 Encounter for other preprocedural examination: Secondary | ICD-10-CM | POA: Diagnosis not present

## 2021-12-04 DIAGNOSIS — N201 Calculus of ureter: Secondary | ICD-10-CM | POA: Insufficient documentation

## 2021-12-04 HISTORY — DX: Other specified postprocedural states: Z98.890

## 2021-12-04 HISTORY — PX: EXTRACORPOREAL SHOCK WAVE LITHOTRIPSY: SHX1557

## 2021-12-04 SURGERY — LITHOTRIPSY, ESWL
Anesthesia: LOCAL | Laterality: Left

## 2021-12-04 MED ORDER — DIPHENHYDRAMINE HCL 25 MG PO CAPS
25.0000 mg | ORAL_CAPSULE | ORAL | Status: AC
  Administered 2021-12-04: 25 mg via ORAL

## 2021-12-04 MED ORDER — OXYCODONE-ACETAMINOPHEN 5-325 MG PO TABS: 1.0000 | ORAL_TABLET | Freq: Four times a day (QID) | ORAL | 0 refills | Status: DC | PRN

## 2021-12-04 MED ORDER — SODIUM CHLORIDE 0.9 % IV SOLN: INTRAVENOUS | Status: DC

## 2021-12-04 MED ORDER — DIPHENHYDRAMINE HCL 25 MG PO CAPS
ORAL_CAPSULE | ORAL | Status: AC
  Filled 2021-12-04: qty 1

## 2021-12-04 MED ORDER — TAMSULOSIN HCL 0.4 MG PO CAPS: 0.4000 mg | ORAL_CAPSULE | Freq: Every day | ORAL | 0 refills | Status: DC

## 2021-12-04 MED ORDER — CIPROFLOXACIN HCL 500 MG PO TABS
500.0000 mg | ORAL_TABLET | ORAL | Status: AC
  Administered 2021-12-04: 500 mg via ORAL

## 2021-12-04 MED ORDER — DIAZEPAM 5 MG PO TABS
10.0000 mg | ORAL_TABLET | ORAL | Status: AC
  Administered 2021-12-04: 10 mg via ORAL

## 2021-12-04 MED ORDER — DIAZEPAM 5 MG PO TABS
ORAL_TABLET | ORAL | Status: AC
  Filled 2021-12-04: qty 2

## 2021-12-04 MED ORDER — CIPROFLOXACIN HCL 500 MG PO TABS
ORAL_TABLET | ORAL | Status: AC
  Filled 2021-12-04: qty 1

## 2021-12-05 ENCOUNTER — Encounter (HOSPITAL_BASED_OUTPATIENT_CLINIC_OR_DEPARTMENT_OTHER): Payer: Self-pay | Admitting: Urology

## 2021-12-13 ENCOUNTER — Other Ambulatory Visit: Payer: Self-pay | Admitting: Obstetrics and Gynecology

## 2021-12-13 DIAGNOSIS — Z1231 Encounter for screening mammogram for malignant neoplasm of breast: Secondary | ICD-10-CM

## 2021-12-18 ENCOUNTER — Ambulatory Visit
Admission: RE | Admit: 2021-12-18 | Discharge: 2021-12-18 | Disposition: A | Discharge status: Home / Self Care | Payer: BC Managed Care – PPO | Source: Ambulatory Visit | Attending: Obstetrics and Gynecology | Admitting: Obstetrics and Gynecology

## 2021-12-18 DIAGNOSIS — N201 Calculus of ureter: Secondary | ICD-10-CM | POA: Diagnosis not present

## 2021-12-18 DIAGNOSIS — Z1231 Encounter for screening mammogram for malignant neoplasm of breast: Secondary | ICD-10-CM

## 2022-01-29 ENCOUNTER — Other Ambulatory Visit: Payer: Self-pay | Admitting: Physician Assistant

## 2022-02-13 ENCOUNTER — Encounter: Payer: Self-pay | Admitting: Physician Assistant

## 2022-02-13 ENCOUNTER — Telehealth: Payer: Self-pay | Admitting: Physician Assistant

## 2022-02-13 ENCOUNTER — Telehealth (INDEPENDENT_AMBULATORY_CARE_PROVIDER_SITE_OTHER): Payer: BC Managed Care – PPO | Admitting: Physician Assistant

## 2022-02-13 VITALS — Temp 97.6°F | Ht 60.0 in | Wt 174.0 lb

## 2022-02-13 DIAGNOSIS — R519 Headache, unspecified: Secondary | ICD-10-CM | POA: Diagnosis not present

## 2022-02-13 NOTE — Telephone Encounter (Signed)
Pt had virtual visit at 10am

## 2022-02-13 NOTE — Telephone Encounter (Signed)
Patient states she has a sinus infection - booked a vv apt for 9/6 at 1145am - would like medications for it to be sent in today of possible as she states she has had this before -

## 2022-02-13 NOTE — Progress Notes (Signed)
   Virtual Visit via Video Note  I connected with  [name]  on 02/13/22 at 10:00 AM EDT by a video enabled telemedicine application and verified that I am speaking with the correct person using two identifiers.  Location: Patient: home Provider: Nature conservation officer at [employer] Persons present: Patient and myself   I discussed the limitations of evaluation and management by telemedicine and the availability of in person appointments. The patient expressed understanding and agreed to proceed.   History of Present Illness:  Chief complaint: Sinus pressure headache - across the eyes currently Symptom onset: 2-3 days ago Pertinent positives: Slight runny nose, fatigue Pertinent negatives: Chest pain, SOB, ST, cough, chills, body aches  Sick exposure: No known sick exposures     Observations/Objective:  Speaking clearly without signs of distress or pauses.   Assessment and Plan:  1. Sinus headache Reassured patient that similar symptoms going around the city right now.  Likely a viral etiology.  No red flags while speaking with her.  I did suggest that she take a home COVID test and let me know the results of this.  She should continue conservative treatment at this time including rest, fluids, ibuprofen or Tylenol, Sudafed.  Patient knows to present to the emergency department if severely acute worse or change in symptoms.  She will keep me updated.   Follow Up Instructions:    I discussed the assessment and treatment plan with the patient. The patient was provided an opportunity to ask questions and all were answered. The patient agreed with the plan and demonstrated an understanding of the instructions.   The patient was advised to call back or seek an in-person evaluation if the symptoms worsen or if the condition fails to improve as anticipated.  Video connection was lost at >50% of the duration of the visit, at which time the remainder of the visit was  completed via audio only.   Total time on phone 6 minutes 49 seconds  Lateesha Bezold M Bary Limbach, PA-C

## 2022-02-14 ENCOUNTER — Telehealth: Payer: BC Managed Care – PPO | Admitting: Physician Assistant

## 2022-04-04 DIAGNOSIS — Z0000 Encounter for general adult medical examination without abnormal findings: Secondary | ICD-10-CM | POA: Diagnosis not present

## 2022-04-04 DIAGNOSIS — Z124 Encounter for screening for malignant neoplasm of cervix: Secondary | ICD-10-CM | POA: Diagnosis not present

## 2022-04-04 DIAGNOSIS — Z01419 Encounter for gynecological examination (general) (routine) without abnormal findings: Secondary | ICD-10-CM | POA: Diagnosis not present

## 2022-06-06 DIAGNOSIS — M25562 Pain in left knee: Secondary | ICD-10-CM | POA: Diagnosis not present

## 2022-06-06 DIAGNOSIS — M25561 Pain in right knee: Secondary | ICD-10-CM | POA: Diagnosis not present

## 2022-06-20 ENCOUNTER — Ambulatory Visit (INDEPENDENT_AMBULATORY_CARE_PROVIDER_SITE_OTHER): Payer: BC Managed Care – PPO | Admitting: Physician Assistant

## 2022-06-20 ENCOUNTER — Encounter: Payer: Self-pay | Admitting: Physician Assistant

## 2022-06-20 VITALS — BP 122/80 | HR 76 | Temp 97.1°F | Ht 60.0 in | Wt 167.2 lb

## 2022-06-20 DIAGNOSIS — U071 COVID-19: Secondary | ICD-10-CM | POA: Diagnosis not present

## 2022-06-20 DIAGNOSIS — R509 Fever, unspecified: Secondary | ICD-10-CM

## 2022-06-20 DIAGNOSIS — R52 Pain, unspecified: Secondary | ICD-10-CM | POA: Diagnosis not present

## 2022-06-20 LAB — POCT INFLUENZA A/B
Influenza A, POC: NEGATIVE
Influenza B, POC: NEGATIVE

## 2022-06-20 LAB — POC COVID19 BINAXNOW: SARS Coronavirus 2 Ag: POSITIVE — AB

## 2022-06-20 MED ORDER — NIRMATRELVIR/RITONAVIR (PAXLOVID)TABLET
3.0000 | ORAL_TABLET | Freq: Two times a day (BID) | ORAL | 0 refills | Status: AC
Start: 2022-06-20 — End: 2022-06-25

## 2022-06-20 NOTE — Progress Notes (Signed)
Subjective:    Patient ID: [name], female    DOB: Sep 01, 1960, 62 y.o.   MRN: [id number]  Chief Complaint  Patient presents with   Nasal Congestion    Pt states symptoms started Monday, chills, muscle body aches, low grade fever, headache, no sore throat, cough has started today. Not tested for Covid; post nasal drip causing tickle and cough    HPI Patient is in today for acute sick symptoms.  Chief complaint: Body aches, chills Symptom onset: 06/18/22  Pertinent positives: fever, HA, cough, congestion Pertinent negatives: SOB, CP, n/v/d, abd pain Treatments tried: Tylenol, ibuprofen Sick exposure: Unsure   Past Medical History:  Diagnosis Date   Abnormal Pap smear 06/12/1999   AMA (advanced maternal age) multigravida 35+    ANEMIA 03/23/2010   Dyspareunia 06/11/2006   Female infertility    GALLSTONES 03/23/2010   H/O constipation 06/12/1999   H/O mumps    H/O varicella    As a child    Headache(784.0) 12/18/2006   Frequently   Heart murmur    History of rubella    As a child    Hx: UTI (urinary tract infection) 04/12/2003   Migraine    Mild mitral valve prolapse    NONSPECIFIC ABNORM FIND RAD&OTH EXAM BILI TRACT 02/02/2008   OVARIAN CYST 03/23/2010   PONV (postoperative nausea and vomiting)    Ulcer 06/12/1999    Past Surgical History:  Procedure Laterality Date   CESAREAN SECTION     x 2   EXTRACORPOREAL SHOCK WAVE LITHOTRIPSY Left 12/04/2021   Procedure: EXTRACORPOREAL SHOCK WAVE LITHOTRIPSY (ESWL);  Surgeon: Ceasar Mons, MD;  Location: [facility];  Service: Urology;  Laterality: Left;   LAPAROSCOPIC ABLATION RENAL MASS     LAPAROSCOPIC OVARIAN CYSTECTOMY     WISDOM TOOTH EXTRACTION      Family History  Problem Relation Age of Onset   Hypertension Mother    Heart disease Father    Hypertension Maternal Aunt    Asthma Maternal Aunt    Diabetes Maternal Aunt    Colon cancer Neg Hx    Esophageal cancer Neg Hx     Stomach cancer Neg Hx    Rectal cancer Neg Hx    Breast cancer Neg Hx     Social History   Tobacco Use   Smoking status: Never   Smokeless tobacco: Never  Vaping Use   Vaping Use: Never used  Substance Use Topics   Alcohol use: No    Alcohol/week: 0.0 standard drinks of alcohol   Drug use: No     Allergies  Allergen Reactions   Imitrex [Sumatriptan] Anaphylaxis   Latex Itching   Penicillins Hives and Swelling    Review of Systems NEGATIVE UNLESS OTHERWISE INDICATED IN HPI      Objective:     BP 122/80 (BP Location: Left Arm)   Pulse 76   Temp (!) 97.1 F (36.2 C) (Temporal)   Ht 5' (1.524 m)   Wt 167 lb 3.2 oz (75.8 kg)   LMP 06/11/2014   SpO2 97%   BMI 32.65 kg/m   Wt Readings from Last 3 Encounters:  06/20/22 167 lb 3.2 oz (75.8 kg)  02/13/22 174 lb (78.9 kg)  12/04/21 174 lb (78.9 kg)    BP Readings from Last 3 Encounters:  06/20/22 122/80  12/04/21 (!) 154/86  11/26/21 131/64     Physical Exam Vitals and nursing note reviewed.  Constitutional:  General: She is not in acute distress.    Appearance: Normal appearance. She is ill-appearing.  HENT:     Head: Normocephalic.     Right Ear: Tympanic membrane, ear canal and external ear normal.     Left Ear: Tympanic membrane, ear canal and external ear normal.     Nose: Congestion present.     Mouth/Throat:     Mouth: Mucous membranes are moist.     Pharynx: No oropharyngeal exudate or posterior oropharyngeal erythema.  Eyes:     Extraocular Movements: Extraocular movements intact.     Conjunctiva/sclera: Conjunctivae normal.     Pupils: Pupils are equal, round, and reactive to light.  Cardiovascular:     Rate and Rhythm: Normal rate and regular rhythm.     Pulses: Normal pulses.     Heart sounds: Normal heart sounds. No murmur heard. Pulmonary:     Effort: Pulmonary effort is normal. No respiratory distress.     Breath sounds: Normal breath sounds. No wheezing.  Musculoskeletal:      Cervical back: Normal range of motion.  Skin:    General: Skin is warm.  Neurological:     Mental Status: She is alert and oriented to person, place, and time.  Psychiatric:        Mood and Affect: Mood normal.        Behavior: Behavior normal.        Assessment & Plan:  COVID-19  Fever, unspecified fever cause -     POC COVID-19 BinaxNow -     POCT Influenza A/B  Generalized body aches -     POC COVID-19 BinaxNow -     POCT Influenza A/B  Other orders -     nirmatrelvir/ritonavir; Take 3 tablets by mouth 2 (two) times daily for 5 days. (Take nirmatrelvir 150 mg two tablets twice daily for 5 days and ritonavir 100 mg one tablet twice daily for 5 days) Patient GFR is >60  Dispense: 30 tablet; Refill: 0   Diagnosis of COVID-19 confirmed via in-office testing. Discussed treatment with antiviral therapy as she is within 5 day window of symptom onset. Risks versus benefits discussed.  Will start Paxlovid, normal GFR history and no contraindications. Possible SE's discussed with pt. Advised self-isolation at home for the next 5 days and then masking around others for at least an additional 5 days.  Treat supportively at this time including sleeping prone, deep breathing exercises, pushing fluids, walking every few hours, vitamins C and D, and Tylenol or ibuprofen as needed.  The patient understands that COVID-19 illness can wax and wane.  Should the symptoms acutely worsen or patient starts to experience sudden shortness of breath, chest pain, severe weakness, the patient will go straight to the emergency department.  Also advised home pulse oximetry monitoring and for any reading consistently under 92%, should also report to the emergency department.  The patient will continue to keep [place] updated.    Return if symptoms worsen or fail to improve.     Thao Vanover M Orvie Caradine, PA-C

## 2022-12-12 ENCOUNTER — Encounter: Payer: Self-pay | Admitting: Physician Assistant

## 2022-12-12 ENCOUNTER — Ambulatory Visit (INDEPENDENT_AMBULATORY_CARE_PROVIDER_SITE_OTHER): Payer: BC Managed Care – PPO | Admitting: Physician Assistant

## 2022-12-12 VITALS — BP 134/86 | HR 82 | Temp 97.5°F | Ht 60.0 in | Wt 171.4 lb

## 2022-12-12 DIAGNOSIS — Z23 Encounter for immunization: Secondary | ICD-10-CM

## 2022-12-12 DIAGNOSIS — N951 Menopausal and female climacteric states: Secondary | ICD-10-CM | POA: Insufficient documentation

## 2022-12-12 DIAGNOSIS — Z1331 Encounter for screening for depression: Secondary | ICD-10-CM | POA: Insufficient documentation

## 2022-12-12 DIAGNOSIS — N926 Irregular menstruation, unspecified: Secondary | ICD-10-CM | POA: Insufficient documentation

## 2022-12-12 DIAGNOSIS — E669 Obesity, unspecified: Secondary | ICD-10-CM | POA: Insufficient documentation

## 2022-12-12 DIAGNOSIS — Z7184 Encounter for health counseling related to travel: Secondary | ICD-10-CM | POA: Diagnosis not present

## 2022-12-12 MED ORDER — AZITHROMYCIN 250 MG PO TABS: ORAL_TABLET | ORAL | 0 refills | Status: DC

## 2022-12-12 NOTE — Progress Notes (Signed)
Subjective:    Patient ID: [name], female    DOB: [birth year], 63 y.o.   MRN: [id number]  Chief Complaint  Patient presents with   Immunizations    Pt getting ready to go out of the country and would like to discuss vaccines she may need to get before traveling. States recommended online Hep A and Hep B    HPI Patient is in today for travel advice. Going to [place] in two days for a 10 day mission trip. She looked online and thinks she may need Hep A and Hep B vaccines. She has been vaccinated as far as she knows as a child. She has been feeling well. No other health concerns today.   Past Medical History:  Diagnosis Date   Abnormal Pap smear 06/12/1999   AMA (advanced maternal age) multigravida 35+    ANEMIA 03/23/2010   Dyspareunia 06/11/2006   Female infertility    GALLSTONES 03/23/2010   H/O constipation 06/12/1999   H/O mumps    H/O varicella    As a child    Headache(784.0) 12/18/2006   Frequently   Heart murmur    History of rubella    As a child    Hx: UTI (urinary tract infection) 04/12/2003   Migraine    Mild mitral valve prolapse    NONSPECIFIC ABNORM FIND RAD&OTH EXAM BILI TRACT 02/02/2008   OVARIAN CYST 03/23/2010   PONV (postoperative nausea and vomiting)    Ulcer 06/12/1999    Past Surgical History:  Procedure Laterality Date   CESAREAN SECTION     x 2   EXTRACORPOREAL SHOCK WAVE LITHOTRIPSY Left 12/04/2021   Procedure: EXTRACORPOREAL SHOCK WAVE LITHOTRIPSY (ESWL);  Surgeon: Rene Paci, MD;  Location: [facility];  Service: Urology;  Laterality: Left;   LAPAROSCOPIC ABLATION RENAL MASS     LAPAROSCOPIC OVARIAN CYSTECTOMY     WISDOM TOOTH EXTRACTION      Family History  Problem Relation Age of Onset   Hypertension Mother    Heart disease Father    Hypertension Maternal Aunt    Asthma Maternal Aunt    Diabetes Maternal Aunt    Colon cancer Neg Hx    Esophageal cancer Neg Hx    Stomach cancer Neg Hx     Rectal cancer Neg Hx    Breast cancer Neg Hx     Social History   Tobacco Use   Smoking status: Never   Smokeless tobacco: Never  Vaping Use   Vaping Use: Never used  Substance Use Topics   Alcohol use: No    Alcohol/week: 0.0 standard drinks of alcohol   Drug use: No     Allergies  Allergen Reactions   Imitrex [Sumatriptan] Anaphylaxis   Latex Itching   Penicillins Hives and Swelling    Review of Systems NEGATIVE UNLESS OTHERWISE INDICATED IN HPI      Objective:     BP 134/86 (BP Location: Left Arm)   Pulse 82   Temp (!) 97.5 F (36.4 C) (Temporal)   Ht 5' (1.524 m)   Wt 171 lb 6.4 oz (77.7 kg)   LMP 06/11/2014   SpO2 99%   BMI 33.47 kg/m   Wt Readings from Last 3 Encounters:  12/12/22 171 lb 6.4 oz (77.7 kg)  06/20/22 167 lb 3.2 oz (75.8 kg)  02/13/22 174 lb (78.9 kg)    BP Readings from Last 3 Encounters:  12/12/22 134/86  06/20/22 122/80  12/04/21 (!) 154/86  Physical Exam Vitals and nursing note reviewed.  Constitutional:      Appearance: Normal appearance.  Eyes:     Extraocular Movements: Extraocular movements intact.     Conjunctiva/sclera: Conjunctivae normal.     Pupils: Pupils are equal, round, and reactive to light.  Cardiovascular:     Rate and Rhythm: Normal rate and regular rhythm.     Pulses: Normal pulses.     Heart sounds: No murmur heard. Pulmonary:     Effort: Pulmonary effort is normal.     Breath sounds: Normal breath sounds.  Musculoskeletal:     Right lower leg: No edema.     Left lower leg: No edema.  Neurological:     General: No focal deficit present.     Mental Status: She is alert and oriented to person, place, and time.  Psychiatric:        Mood and Affect: Mood normal.        Behavior: Behavior normal.        Assessment & Plan:  Travel advice encounter -     Azithromycin; Take two tablets on day one, followed by one tablet daily for the next four days.  Dispense: 6 tablet; Refill: 0   I  personally checked CDC travel website with patient today in-office.  She may be due for hep A and hep B vaccines, no records in [place], agreed to update today with Twinrix vaccine. Avoid contaminated water and insect bites - look online for ways to do so. Z-pak in case of traveler's diarrhea. Pt to call back if any questions or concerns.     Return in about 6 weeks (around 01/23/2023) for physical, fasting labs .    Adrain Butrick M Lu Paradise, PA-C

## 2022-12-12 NOTE — Patient Instructions (Signed)
Twinrix (Hep A & Hep B) updated today.  Use Z-pak if needed in case of traveler's diarrhea.  Have a safe and wonderful trip! Thank you for all you do.

## 2022-12-24 ENCOUNTER — Telehealth: Payer: Self-pay | Admitting: Physician Assistant

## 2022-12-24 ENCOUNTER — Other Ambulatory Visit: Payer: Self-pay

## 2022-12-24 ENCOUNTER — Emergency Department (HOSPITAL_BASED_OUTPATIENT_CLINIC_OR_DEPARTMENT_OTHER)
Admission: EM | Admit: 2022-12-24 | Discharge: 2022-12-24 | Disposition: A | Discharge status: Home / Self Care | Payer: BC Managed Care – PPO | Attending: Emergency Medicine | Admitting: Emergency Medicine

## 2022-12-24 ENCOUNTER — Emergency Department (HOSPITAL_BASED_OUTPATIENT_CLINIC_OR_DEPARTMENT_OTHER): Payer: BC Managed Care – PPO

## 2022-12-24 DIAGNOSIS — M7989 Other specified soft tissue disorders: Secondary | ICD-10-CM | POA: Diagnosis not present

## 2022-12-24 DIAGNOSIS — M79605 Pain in left leg: Secondary | ICD-10-CM | POA: Diagnosis not present

## 2022-12-24 DIAGNOSIS — R600 Localized edema: Secondary | ICD-10-CM | POA: Insufficient documentation

## 2022-12-24 DIAGNOSIS — Z91040 Latex allergy status: Secondary | ICD-10-CM | POA: Diagnosis not present

## 2022-12-24 DIAGNOSIS — E876 Hypokalemia: Secondary | ICD-10-CM | POA: Diagnosis not present

## 2022-12-24 DIAGNOSIS — R609 Edema, unspecified: Secondary | ICD-10-CM

## 2022-12-24 LAB — BASIC METABOLIC PANEL
Anion gap: 9 (ref 5–15)
BUN: 17 mg/dL (ref 6–20)
CO2: 26 mmol/L (ref 22–32)
Calcium: 9.1 mg/dL (ref 8.9–10.3)
Chloride: 105 mmol/L (ref 98–111)
Creatinine, Ser: 0.81 mg/dL (ref 0.44–1.00)
GFR, Estimated: >60 mL/min (ref >60)
Glucose, Bld: 99 mg/dL (ref 70–99)
Potassium: 3.2 mmol/L — ABNORMAL LOW (ref 3.5–5.1)
Sodium: 140 mmol/L (ref 135–145)

## 2022-12-24 LAB — CBC
HCT: 32.2 % — ABNORMAL LOW (ref 36.0–46.0)
Hemoglobin: 10.8 g/dL — ABNORMAL LOW (ref 12.0–15.0)
MCH: 29.9 pg (ref 26.0–34.0)
MCHC: 33.5 g/dL (ref 30.0–36.0)
MCV: 89.2 fL (ref 80.0–100.0)
Platelets: 332 10*3/uL (ref 150–400)
RBC: 3.61 MIL/uL — ABNORMAL LOW (ref 3.87–5.11)
RDW: 15.2 % (ref 11.5–15.5)
WBC: 5.9 10*3/uL (ref 4.0–10.5)
nRBC: 0 % (ref 0.0–0.2)

## 2022-12-24 LAB — BRAIN NATRIURETIC PEPTIDE: B Natriuretic Peptide: 15.9 pg/mL (ref 0.0–100.0)

## 2022-12-24 MED ORDER — POTASSIUM CHLORIDE CRYS ER 20 MEQ PO TBCR
40.0000 meq | EXTENDED_RELEASE_TABLET | Freq: Once | ORAL | Status: AC
  Administered 2022-12-24: 40 meq via ORAL
  Filled 2022-12-24: qty 2

## 2022-12-24 NOTE — ED Triage Notes (Signed)
Patient presents to ED via POV from home. Patient returned from an [place] mission trip at midnight. Here with bilateral leg pain, left > right. Also reports red rash to bilateral legs.

## 2022-12-24 NOTE — ED Notes (Signed)
Pedal pulses noted in feet bilat.

## 2022-12-24 NOTE — ED Provider Notes (Signed)
[facility] Provider Note   CSN: [id number] Arrival date & time: 12/24/22  1326     History  Chief Complaint  Patient presents with   Leg Swelling    AMELIYAH THOMASSIE is a 62 y.o. female with overall noncontributory past medical history presents with concern for bilateral leg swelling with possible left greater than right after returning from mission trip.  Patient reports that she was in [place], she had been spending a lot of time on her stomach swelling and pain of her lower extremities.  She reports possible mild shortness of breath but denies any pleuritic chest pain, pain at rest.  No previous history of blood clots.  Does not think that she was bitten by any insects but is unsure.  No previous history of heart failure, kidney disease.  HPI     Home Medications Prior to Admission medications   Medication Sig Start Date End Date Taking? Authorizing Provider  azithromycin (ZITHROMAX Z-PAK) 250 MG tablet Take two tablets on day one, followed by one tablet daily for the next four days. 12/12/22   Allwardt, Crist Infante, PA-C  gabapentin (NEURONTIN) 300 MG capsule TAKE 1 CAPSULE BY MOUTH EVERYDAY AT BEDTIME Patient not taking: Reported on 12/12/2022 01/30/22   Allwardt, Crist Infante, PA-C  ketorolac (TORADOL) 10 MG tablet Take 1 tablet (10 mg total) by mouth every 6 (six) hours as needed. Patient not taking: Reported on 12/12/2022 11/03/21   Cheryll Cockayne, MD  meloxicam (MOBIC) 15 MG tablet Take 1 tablet (15 mg total) by mouth daily. Do not take any other NSAIDs with this medication (Aleve, Ibuprofen, Motrin, etc). Patient not taking: Reported on 12/12/2022 10/11/21   Allwardt, Alyssa M, PA-C  ondansetron (ZOFRAN-ODT) 4 MG disintegrating tablet Take 1 tablet (4 mg total) by mouth every 8 (eight) hours as needed for nausea or vomiting. Patient not taking: Reported on 12/12/2022 11/03/21   Cheryll Cockayne, MD  tamsulosin (FLOMAX) 0.4 MG CAPS capsule Take 1 capsule  (0.4 mg total) by mouth daily. Patient not taking: Reported on 12/12/2022 12/04/21   Rene Paci, MD  triamcinolone cream (KENALOG) 0.1 % Apply thin layer to affected areas twice daily as needed. Do not use consistently for more than 2 weeks at a time. Patient not taking: Reported on 12/12/2022 02/10/21   Allwardt, Crist Infante, PA-C      Allergies    Imitrex [sumatriptan], Latex, and Penicillins    Review of Systems   Review of Systems  All other systems reviewed and are negative.   Physical Exam Updated Vital Signs BP (!) 135/59 (BP Location: Left Arm)   Pulse 69   Temp 98.3 F (36.8 C)   Resp 18   Ht 5' (1.524 m)   Wt 77.1 kg   LMP 06/11/2014   SpO2 100%   BMI 33.20 kg/m  Physical Exam Vitals and nursing note reviewed.  Constitutional:      General: She is not in acute distress.    Appearance: Normal appearance.  HENT:     Head: Normocephalic and atraumatic.  Eyes:     General:        Right eye: No discharge.        Left eye: No discharge.  Cardiovascular:     Rate and Rhythm: Normal rate and regular rhythm.     Pulses: Normal pulses.  Pulmonary:     Effort: Pulmonary effort is normal. No respiratory distress.  Musculoskeletal:  General: No deformity.     Right lower leg: Edema present.     Left lower leg: Edema present.     Comments: 1-2+ nonpitting edema bilateral lower extremities, venous stasis rash noted  Skin:    General: Skin is warm and dry.  Neurological:     Mental Status: She is alert and oriented to person, place, and time.  Psychiatric:        Mood and Affect: Mood normal.        Behavior: Behavior normal.     ED Results / Procedures / Treatments   Labs (all labs ordered are listed, but only abnormal results are displayed) Labs Reviewed  CBC - Abnormal; Notable for the following components:      Result Value   RBC 3.61 (*)    Hemoglobin 10.8 (*)    HCT 32.2 (*)    All other components within normal limits  BASIC METABOLIC  PANEL - Abnormal; Notable for the following components:   Potassium 3.2 (*)    All other components within normal limits  BRAIN NATRIURETIC PEPTIDE    EKG None  Radiology US Venous Img Lower Unilateral Left  Result Date: 12/24/2022 CLINICAL DATA:  Left leg pain and rash EXAM: LEFT LOWER EXTREMITY VENOUS DOPPLER ULTRASOUND TECHNIQUE: Gray-scale sonography with graded compression, as well as color Doppler and duplex ultrasound were performed to evaluate the lower extremity deep venous systems from the level of the common femoral vein and including the common femoral, femoral, profunda femoral, popliteal and calf veins including the posterior tibial, peroneal and gastrocnemius veins when visible. The superficial great saphenous vein was also interrogated. Spectral Doppler was utilized to evaluate flow at rest and with distal augmentation maneuvers in the common femoral, femoral and popliteal veins. COMPARISON:  None Available. FINDINGS: Contralateral Common Femoral Vein: Respiratory phasicity is normal and symmetric with the symptomatic side. No evidence of thrombus. Normal compressibility. Common Femoral Vein: No evidence of thrombus. Normal compressibility, respiratory phasicity and response to augmentation. Saphenofemoral Junction: No evidence of thrombus. Normal compressibility and flow on color Doppler imaging. Profunda Femoral Vein: No evidence of thrombus. Normal compressibility and flow on color Doppler imaging. Femoral Vein: No evidence of thrombus. Normal compressibility, respiratory phasicity and response to augmentation. Popliteal Vein: No evidence of thrombus. Normal compressibility, respiratory phasicity and response to augmentation. Calf Veins: No evidence of thrombus. Normal compressibility and flow on color Doppler imaging. Superficial Great Saphenous Vein: No evidence of thrombus. Normal compressibility. Venous Reflux:  None. Other Findings:  None. IMPRESSION: No evidence of deep venous  thrombosis in the left lower extremity. Electronically Signed   By: Jacob Moores [name]   On: 12/24/2022 14:10    Procedures Procedures    Medications Ordered in ED Medications  potassium chloride SA (KLOR-CON M) CR tablet 40 mEq (40 mEq Oral Given 12/24/22 1538)    ED Course/ Medical Decision Making/ A&P                             Medical Decision Making  This patient is a 62 y.o. female  who presents to the ED for concern of leg swelling.   Differential diagnoses prior to evaluation: The emergent differential diagnosis includes, but is not limited to, cellulitis, DVT, kidney dysfunction, heart failure, versus dependent edema, lymphedema versus other. This is not an exhaustive differential.   Past Medical History / Co-morbidities / Social History: Obesity, otherwise overall unremarkable, no current birth control  usage  Additional history: Chart reviewed. Pertinent results include: Recent travel abroad with greater than 6-hour flight  Physical Exam: Physical exam performed. The pertinent findings include: Patient with bilateral lower extremity edema, on my exam appreciate significant left greater than right swelling.  She has some venous stasis dermatitis.  DP, PT pulses are 2+ bilaterally.  Lab Tests/Imaging studies: I personally interpreted labs/imaging and the pertinent results include: BMP with mild hypokalemia, potassium 3.2, we will orally replete.  CBC overall unremarkable other than mild anemia, hemoglobin 10.1.  Normal BNP.  Notably with normal kidney function..  I independently interpreted venous ultrasound of the left lower extremity which shows no evidence of acute DVT I agree with the radiologist interpretation.  Medications: I ordered medication including potassium chloride for hypokalemia.  I have reviewed the patients home medicines and have made adjustments as needed.   Disposition: After consideration of the diagnostic results and the patients response to  treatment, I feel that with normal kidney function, no evidence of heart failure I have high suspicion that patient has some dependent edema likely secondary to her travel and ambulation, with no evidence of DVT at this time.  Encourage compression stockings, elevation, ibuprofen for pain.   emergency department workup does not suggest an emergent condition requiring admission or immediate intervention beyond what has been performed at this time. The plan is: as above. The patient is safe for discharge and has been instructed to return immediately for worsening symptoms, change in symptoms or any other concerns.  Final Clinical Impression(s) / ED Diagnoses Final diagnoses:  Dependent edema  Hypokalemia    Rx / DC Orders ED Discharge Orders     None         Olene Floss, PA-C 12/24/22 1658    Melene Plan, DO 12/25/22 [phone]

## 2022-12-24 NOTE — ED Notes (Signed)
Pt. Was on a Mission Trip  in [place] last week.  Pt. Now has edema in both lower extremities with redness noted in the lower R leg.    Pt. Reports pain in the lower legs as well.

## 2022-12-24 NOTE — Discharge Instructions (Addendum)
Please take your legs elevated as much as you are able to do and wear the compression stockings.  The pain should improve once the swelling has improved.  Please follow-up with your primary care doctor for recheck.

## 2022-12-24 NOTE — Telephone Encounter (Signed)
FYI: This call has been transferred to triage nurse: the Triage Nurse. Once the result note has been entered staff can address the message at that time.  Patient called in with the following symptoms:  Red Word: Legs swelling, hurt to touch, & red blotches   Please advise at Mobile [phone] (mobile)  Message is routed to Provider Pool.

## 2022-12-24 NOTE — Telephone Encounter (Signed)
Final Outcome: Go to ED Now. Per note, pt agreed to comply.   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 She also states she has leg swelling and pain. She currently has red botches on them as well. Translation No Nurse Assessment Nurse: Andrey Campanile, RN, Marylene Land Date/Time Lamount Cohen Time): 12/24/2022 9:13:33 AM Confirm and document reason for call. If symptomatic, describe symptoms. ---Caller states she just came back home from being out of the country. Both legs are swollen. Left leg is worse as far as pain/swelling. Also, pain in both legs. Also, has red botches on leg. Does the patient have any new or worsening symptoms? ---Yes Will a triage be completed? ---Yes Related visit to physician within the last 2 weeks? ---No Does the PT have any chronic conditions? (i.e. diabetes, asthma, this includes High risk factors for pregnancy, etc.) ---No Is this a behavioral health or substance abuse call? ---No Guidelines Guideline Title Affirmed Question Affirmed Notes Nurse Date/Time Lamount Cohen Time) Leg Swelling and Edema Patient sounds very sick or weak to the triager Andrey Campanile, RN, Marylene Land 12/24/2022 9:18:05 AM Disp. Time Lamount Cohen Time) Disposition Final User 12/24/2022 9:20:50 AM Go to ED Now (or PCP triage) Yes Andrey Campanile, RN, Marylene Land Final  Disposition 12/24/2022 9:20:50 AM Go to ED Now (or PCP triage) Thelma Barge, RN, Rosalyn Charters Disagree/Comply Comply Caller Understands Yes PreDisposition Did not know what to do Care Advice Given Per Guideline GO TO ED NOW (OR PCP TRIAGE): * IF NO PCP (PRIMARY CARE PROVIDER) SECOND-LEVEL TRIAGE: You need to be seen within the next hour. Go to the ED/UCC at _____________ Hospital. Leave as soon as you can. ANOTHER ADULT SHOULD DRIVE: * It is better and safer if another adult drives instead of you.

## 2023-02-04 ENCOUNTER — Telehealth: Payer: BC Managed Care – PPO | Admitting: Nurse Practitioner

## 2023-02-04 ENCOUNTER — Encounter: Payer: Self-pay | Admitting: Nurse Practitioner

## 2023-02-04 ENCOUNTER — Telehealth: Payer: BC Managed Care – PPO | Admitting: Family Medicine

## 2023-02-04 DIAGNOSIS — J069 Acute upper respiratory infection, unspecified: Secondary | ICD-10-CM | POA: Diagnosis not present

## 2023-02-04 NOTE — Progress Notes (Signed)
Virtual Visit via Video Note  I connected withNAME@ on 02/04/23 at 11:00 AM EDT by a video enabled telemedicine application and verified that I am speaking with the correct person using two identifiers.  Location: Patient:Home Provider: Office Participants: patient and provider  I discussed the limitations of evaluation and management by telemedicine and the availability of in person appointments. I also discussed with the patient that there may be a patient responsible charge related to this service. The patient expressed understanding and agreed to proceed.  QI:ONGEX congestion. Onset yesterday. No COVID test completed  History of Present Illness: Sinusitis This is a new problem. The current episode started yesterday. The problem is unchanged. There has been no fever. Associated symptoms include congestion, headaches and sinus pressure. Pertinent negatives include no chills, coughing, diaphoresis, ear pain, hoarse voice, neck pain, shortness of breath, sneezing, sore throat or swollen glands. Past treatments include oral decongestants and acetaminophen. The treatment provided no relief.    Observations/Objective: Physical Exam Vitals and nursing note reviewed.  Constitutional:      General: She is not in acute distress.    Appearance: She is not ill-appearing.  Pulmonary:     Effort: Pulmonary effort is normal.  Neurological:     Mental Status: She is alert and oriented to person, place, and time.     Assessment and Plan: [name] was seen today for sinusitis.  Diagnoses and all orders for this visit:  Viral upper respiratory tract infection   Follow Up Instructions: Please complete a Home COVID test.  Flonase and Afrin use: apply 1spray of afrin in each nare, wait , then apply 2sprays of flonase in each nare. Use both nasal spray consecutively x 3days, then flonase only for at least 14days.  Avoid decongestants if you have high blood pressure. Encourage adequate oral  hydration. Use mucinex for congestion You can use plain "Tylenol" or "Advil" for fever, chills and achyness. Use cool mist humidifier at bedtime to help with nasal congestion and cough.  Cold/cough medications may have tylenol or ibuprofen or guaifenesin or dextromethophan in them, so be careful not to take beyond the recommended dose for each of these medications.   "Common cold" symptoms are usually triggered by a virus.  The antibiotics are usually not necessary. On average, a" viral cold" illness may take 7-10 days to resolve. Please, make an appointment if you are not better or if you're worse.     I discussed the assessment and treatment plan with the patient. The patient was provided an opportunity to ask questions and all were answered. The patient agreed with the plan and demonstrated an understanding of the instructions.   The patient was advised to call back or seek an in-person evaluation if the symptoms worsen or if the condition fails to improve as anticipated.  Adrienne Penna, NP

## 2023-02-04 NOTE — Patient Instructions (Addendum)
Please complete a Home COVID test.  Flonase and Afrin use: apply 1spray of afrin in each nare, wait , then apply 2sprays of flonase in each nare. Use both nasal spray consecutively x 3days, then flonase only for at least 14days.  Avoid decongestants if you have high blood pressure. Encourage adequate oral hydration. Use mucinex for congestion You can use plain "Tylenol" or "Advil" for fever, chills and achyness. Use cool mist humidifier at bedtime to help with nasal congestion and cough.  Cold/cough medications may have tylenol or ibuprofen or guaifenesin or dextromethophan in them, so be careful not to take beyond the recommended dose for each of these medications.   "Common cold" symptoms are usually triggered by a virus.  The antibiotics are usually not necessary. On average, a" viral cold" illness may take 7-10 days to resolve. Please, make an appointment if you are not better or if you're worse.

## 2023-04-02 ENCOUNTER — Other Ambulatory Visit: Payer: Self-pay | Admitting: Obstetrics and Gynecology

## 2023-04-02 DIAGNOSIS — Z1231 Encounter for screening mammogram for malignant neoplasm of breast: Secondary | ICD-10-CM

## 2023-04-26 DIAGNOSIS — H16001 Unspecified corneal ulcer, right eye: Secondary | ICD-10-CM | POA: Diagnosis not present

## 2023-04-27 DIAGNOSIS — B0052 Herpesviral keratitis: Secondary | ICD-10-CM | POA: Diagnosis not present

## 2023-04-30 ENCOUNTER — Ambulatory Visit
Admission: RE | Admit: 2023-04-30 | Discharge: 2023-04-30 | Disposition: A | Discharge status: Home / Self Care | Payer: BC Managed Care – PPO | Source: Ambulatory Visit | Attending: Obstetrics and Gynecology | Admitting: Obstetrics and Gynecology

## 2023-04-30 ENCOUNTER — Ambulatory Visit: Payer: BC Managed Care – PPO

## 2023-04-30 DIAGNOSIS — B0052 Herpesviral keratitis: Secondary | ICD-10-CM | POA: Diagnosis not present

## 2023-04-30 DIAGNOSIS — Z1231 Encounter for screening mammogram for malignant neoplasm of breast: Secondary | ICD-10-CM | POA: Diagnosis not present

## 2023-04-30 DIAGNOSIS — H1045 Other chronic allergic conjunctivitis: Secondary | ICD-10-CM | POA: Diagnosis not present

## 2023-05-04 DIAGNOSIS — B0052 Herpesviral keratitis: Secondary | ICD-10-CM | POA: Diagnosis not present

## 2023-05-04 DIAGNOSIS — H1045 Other chronic allergic conjunctivitis: Secondary | ICD-10-CM | POA: Diagnosis not present

## 2023-05-07 DIAGNOSIS — H52223 Regular astigmatism, bilateral: Secondary | ICD-10-CM | POA: Diagnosis not present

## 2023-05-07 DIAGNOSIS — H524 Presbyopia: Secondary | ICD-10-CM | POA: Diagnosis not present

## 2023-05-07 DIAGNOSIS — H2513 Age-related nuclear cataract, bilateral: Secondary | ICD-10-CM | POA: Diagnosis not present

## 2023-05-07 DIAGNOSIS — H5213 Myopia, bilateral: Secondary | ICD-10-CM | POA: Diagnosis not present

## 2023-07-04 ENCOUNTER — Ambulatory Visit: Payer: Self-pay | Admitting: Physician Assistant

## 2023-07-04 NOTE — Telephone Encounter (Signed)
  1st attempt - attempted to call patient for triage of symptoms. Received VMB, LVM to return call. Will continue to attempt.    Copied from CRM [id number]. Topic: Clinical - Prescription Issue >> Jul 04, 2023  2:48 PM Tiffany H wrote: Reason for CRM: Patient called to advise that she's coming down with the flu due to exposure from family members. Patient requests a prescription for Tamaflu to be sent to [facility] @ [address], [place], [place] [address]  [phone]  Patient's son's doctor advised that she start on Tamaflu now. She has a tickle in her throat.

## 2023-07-04 NOTE — Telephone Encounter (Signed)
FYI pt scheduled for VV tomorrow

## 2023-07-04 NOTE — Telephone Encounter (Signed)
Copied from CRM [id number]. Topic: Clinical - Prescription Issue >> Jul 04, 2023  2:48 PM Tiffany H wrote: Reason for CRM: Patient called to advise that she's coming down with the flu due to exposure from family members. Patient requests a prescription for Tamaflu to be [facility] to [facility] @ [address], [place], [place] [address]  [phone]  Patient's son's doctor advised that she start on Tamaflu now. She has a tickle in her throat.   Chief Complaint: Flu exposure  Symptoms: Tickle in throat Pertinent Negatives: Patient denies any other symptom at this time.  Disposition: [] ED /[] Urgent Care (no appt availability in office) / [] Appointment(In office/virtual)/ []  [facility]/ [x] Home Care/ [] Refused Recommended Disposition /[] [facility]/ []  Follow-up with PCP Additional Notes: Patient's son was diagnosed with the flu today and his pediatrician advised that everyone in the house should be started on Tamiflu. Patient states that she currently has a tickle in her throat but no other symptom. Patient already has a virtual appointment set up for tomorrow. Patient has no other questions or concerns at this time. Patient advised to call back for any new or worsening symptoms.      Reason for Disposition  [1] Influenza EXPOSURE (Close Contact) within last 7 days AND [2] NO respiratory symptoms  Answer Assessment - Initial Assessment Questions 1. TYPE of EXPOSURE: "How were you exposed?" (e.g., close contact, not a close contact)     Close contact  2. DATE of EXPOSURE: "When did the exposure occur?" (e.g., hour, days, weeks)     Today 3. PREGNANCY: "Is there any chance you are pregnant?" "When was your last menstrual period?"     No 4. HIGH RISK for COMPLICATIONS: "Do you have any heart or lung problems?" "Do you have a weakened immune system?" (e.g., CHF, COPD, asthma, HIV positive, chemotherapy, renal failure, diabetes mellitus, sickle cell anemia)     No 5. SYMPTOMS: "Do  you have any symptoms?" (e.g., cough, fever, sore throat, difficulty breathing).     Tickle in throat, no other symptoms  Protocols used: Influenza (Flu) Exposure-A-AH

## 2023-07-04 NOTE — Telephone Encounter (Signed)
2nd attempt to call patient. No answer. LVM. Will attempt call back.    Copied from CRM [id number]. Topic: Clinical - Prescription Issue >> Jul 04, 2023  2:48 PM Tiffany H wrote: Reason for CRM: Patient called to advise that she's coming down with the flu due to exposure from family members. Patient requests a prescription for Tamaflu [facility] to [facility] @ [address], [place], [place] [address]  [phone]  Patient's son's doctor advised that she start on Tamaflu now. She has a tickle in her throat.

## 2023-07-05 ENCOUNTER — Ambulatory Visit: Payer: Self-pay | Admitting: Physician Assistant

## 2023-07-05 ENCOUNTER — Telehealth (INDEPENDENT_AMBULATORY_CARE_PROVIDER_SITE_OTHER): Payer: BC Managed Care – PPO | Admitting: Physician Assistant

## 2023-07-05 VITALS — Ht 60.0 in | Wt 170.0 lb

## 2023-07-05 DIAGNOSIS — Z20828 Contact with and (suspected) exposure to other viral communicable diseases: Secondary | ICD-10-CM

## 2023-07-05 DIAGNOSIS — R6889 Other general symptoms and signs: Secondary | ICD-10-CM | POA: Diagnosis not present

## 2023-07-05 MED ORDER — OSELTAMIVIR PHOSPHATE 75 MG PO CAPS: 75.0000 mg | ORAL_CAPSULE | Freq: Two times a day (BID) | ORAL | 0 refills | Status: DC

## 2023-07-05 MED ORDER — ONDANSETRON 4 MG PO TBDP: 4.0000 mg | ORAL_TABLET | Freq: Three times a day (TID) | ORAL | 0 refills | Status: DC | PRN

## 2023-07-05 NOTE — Telephone Encounter (Signed)
  Chief Complaint: cough Symptoms: cough, body aches, fever Frequency: last week Pertinent Negatives: Patient denies sob Disposition: [] ED /[] Urgent Care (no appt availability in office) / [x] Appointment(In office/virtual)/ []  [facility]/ [] Home Care/ [] Refused Recommended Disposition /[] [facility]/ []  Follow-up with PCP Additional Notes: Patient reports she is feeling worse since she talked to triage yesterday. Patient said she is now running a fever and has a severe cough and body aches. Patient denies sob.  Per protocol, it is still appropraite for patient to keep VV today 1/24. This RN advised patient to call back with worsening symptoms. Patient verbalized understanding.    Copied from CRM [id number]. Topic: Clinical - Red Word Triage >> Jul 05, 2023  9:21 AM Marica Otter wrote: [facility] that prompted transfer to Nurse Triage: Patient having body aches, cough and fever Reason for Disposition  SEVERE coughing spells (e.g., whooping sound after coughing, vomiting after coughing)  Answer Assessment - Initial Assessment Questions 1. ONSET: "When did the cough begin?"      Last week 2. SEVERITY: "How bad is the cough today?"      severe 3. SPUTUM: "Describe the color of your sputum" (none, dry cough; clear, white, yellow, green)     none 4. HEMOPTYSIS: "Are you coughing up any blood?" If so ask: "How much?" (flecks, streaks, tablespoons, etc.)     none 5. DIFFICULTY BREATHING: "Are you having difficulty breathing?" If Yes, ask: "How bad is it?" (e.g., mild, moderate, severe)    - MILD: No SOB at rest, mild SOB with walking, speaks normally in sentences, can lie down, no retractions, pulse < 100.    - MODERATE: SOB at rest, SOB with minimal exertion and prefers to sit, cannot lie down flat, speaks in phrases, mild retractions, audible wheezing, pulse 100-120.    - SEVERE: Very SOB at rest, speaks in single words, struggling to breathe, sitting hunched forward,  retractions, pulse > 120      severe 6. FEVER: "Do you have a fever?" If Yes, ask: "What is your temperature, how was it measured, and when did it start?"     no 7. CARDIAC HISTORY: "Do you have any history of heart disease?" (e.g., heart attack, congestive heart failure)      no 8. LUNG HISTORY: "Do you have any history of lung disease?"  (e.g., pulmonary embolus, asthma, emphysema)     no 9. PE RISK FACTORS: "Do you have a history of blood clots?" (or: recent major surgery, recent prolonged travel, bedridden)     no 10. OTHER SYMPTOMS: "Do you have any other symptoms?" (e.g., runny nose, wheezing, chest pain)       Fever, cough, body aches  Protocols used: Cough - Acute Productive-A-AH

## 2023-07-05 NOTE — Telephone Encounter (Signed)
Noted, appt today.

## 2023-07-05 NOTE — Progress Notes (Signed)
   Virtual Visit via Video Note  I connected with  [name]  on 07/05/23 at 11:00 AM EST by a video enabled telemedicine application and verified that I am speaking with the correct person using two identifiers.  Location: Patient: home Provider: Nature conservation officer at [employer] Persons present: Patient and myself   I discussed the limitations of evaluation and management by telemedicine and the availability of in person appointments. The patient expressed understanding and agreed to proceed.   History of Present Illness:  Discussed the use of AI scribe software for clinical note transcription with the patient, who gave verbal consent to proceed.  History of Present Illness   The patient, with a history of exposure to influenza, presents with symptoms of flu that started yesterday. She reports body aches, cough, chest congestion, and nausea. She also mentions that her son has been diagnosed with the flu. She has not received a flu shot this season and has previously taken Tamiflu, although it has been a long time and she does not recall any unusual reactions. She lives with her two sons, who are also currently unwell.        Observations/Objective:   Gen: Unwell, lying in bed with blankets and robe on Resp: Breathing is even and non-labored Psych: calm/pleasant demeanor Neuro: Alert and Oriented x 3, + facial symmetry, speech is clear.   Assessment and Plan:  Assessment and Plan    Influenza High exposure to flu, symptoms include body aches, dry cough, chest pain, and nausea. No flu shot this season. Previously tolerated Tamiflu. -Start Tamiflu, 75mg  twice daily for 5 days. -Stay hydrated and monitor energy levels. If unable to urinate for 24 hours or unable to lift head off pillow, seek hospital care. -Alternate Tylenol and ibuprofen to manage fever and body aches.  Nausea Associated with flu symptoms, risk of dehydration. -Prescribe Zofran to manage  nausea and prevent dehydration.  Follow-up Monitor symptoms and reach out if condition worsens or does not improve within 7-10 days.        Follow Up Instructions:    I discussed the assessment and treatment plan with the patient. The patient was provided an opportunity to ask questions and all were answered. The patient agreed with the plan and demonstrated an understanding of the instructions.   The patient was advised to call back or seek an in-person evaluation if the symptoms worsen or if the condition fails to improve as anticipated.  Nila Winker M Rosealie Reach, PA-C

## 2023-11-22 ENCOUNTER — Emergency Department (HOSPITAL_BASED_OUTPATIENT_CLINIC_OR_DEPARTMENT_OTHER)

## 2023-11-22 ENCOUNTER — Emergency Department (HOSPITAL_BASED_OUTPATIENT_CLINIC_OR_DEPARTMENT_OTHER)
Admission: EM | Admit: 2023-11-22 | Discharge: 2023-11-22 | Disposition: A | Discharge status: Home / Self Care | Attending: Emergency Medicine | Admitting: Emergency Medicine

## 2023-11-22 ENCOUNTER — Encounter (HOSPITAL_BASED_OUTPATIENT_CLINIC_OR_DEPARTMENT_OTHER): Payer: Self-pay | Admitting: Emergency Medicine

## 2023-11-22 ENCOUNTER — Other Ambulatory Visit: Payer: Self-pay

## 2023-11-22 DIAGNOSIS — D649 Anemia, unspecified: Secondary | ICD-10-CM | POA: Insufficient documentation

## 2023-11-22 DIAGNOSIS — G43809 Other migraine, not intractable, without status migrainosus: Secondary | ICD-10-CM | POA: Insufficient documentation

## 2023-11-22 DIAGNOSIS — Z91040 Latex allergy status: Secondary | ICD-10-CM | POA: Insufficient documentation

## 2023-11-22 DIAGNOSIS — R519 Headache, unspecified: Secondary | ICD-10-CM | POA: Diagnosis not present

## 2023-11-22 LAB — COMPREHENSIVE METABOLIC PANEL WITH GFR
ALT: 15 U/L (ref 0–44)
AST: 21 U/L (ref 15–41)
Albumin: 4.3 g/dL (ref 3.5–5.0)
Alkaline Phosphatase: 50 U/L (ref 38–126)
Anion gap: 10 (ref 5–15)
BUN: 16 mg/dL (ref 6–20)
CO2: 26 mmol/L (ref 22–32)
Calcium: 9.2 mg/dL (ref 8.9–10.3)
Chloride: 107 mmol/L (ref 98–111)
Creatinine, Ser: 0.76 mg/dL (ref 0.44–1.00)
GFR, Estimated: >60 mL/min (ref >60)
Glucose, Bld: 97 mg/dL (ref 70–99)
Potassium: 3.5 mmol/L (ref 3.5–5.1)
Sodium: 142 mmol/L (ref 135–145)
Total Bilirubin: 0.3 mg/dL (ref 0.0–1.2)
Total Protein: 7.3 g/dL (ref 6.5–8.1)

## 2023-11-22 LAB — CBC WITH DIFFERENTIAL/PLATELET
Abs Immature Granulocytes: 0.02 10*3/uL (ref 0.00–0.07)
Basophils Absolute: 0.1 10*3/uL (ref 0.0–0.1)
Basophils Relative: 1 %
Eosinophils Absolute: 0.2 10*3/uL (ref 0.0–0.5)
Eosinophils Relative: 4 %
HCT: 33.4 % — ABNORMAL LOW (ref 36.0–46.0)
Hemoglobin: 11 g/dL — ABNORMAL LOW (ref 12.0–15.0)
Immature Granulocytes: 0 %
Lymphocytes Relative: 36 %
Lymphs Abs: 2.1 10*3/uL (ref 0.7–4.0)
MCH: 29.4 pg (ref 26.0–34.0)
MCHC: 32.9 g/dL (ref 30.0–36.0)
MCV: 89.3 fL (ref 80.0–100.0)
Monocytes Absolute: 0.7 10*3/uL (ref 0.1–1.0)
Monocytes Relative: 12 %
Neutro Abs: 2.8 10*3/uL (ref 1.7–7.7)
Neutrophils Relative %: 47 %
Platelets: 326 10*3/uL (ref 150–400)
RBC: 3.74 MIL/uL — ABNORMAL LOW (ref 3.87–5.11)
RDW: 14.7 % (ref 11.5–15.5)
WBC: 5.8 10*3/uL (ref 4.0–10.5)
nRBC: 0 % (ref 0.0–0.2)

## 2023-11-22 MED ORDER — DIPHENHYDRAMINE HCL 50 MG/ML IJ SOLN
25.0000 mg | Freq: Once | INTRAMUSCULAR | Status: AC
  Administered 2023-11-22: 25 mg via INTRAVENOUS
  Filled 2023-11-22: qty 1

## 2023-11-22 MED ORDER — SODIUM CHLORIDE 0.9 % IV BOLUS
1000.0000 mL | Freq: Once | INTRAVENOUS | Status: AC
  Administered 2023-11-22: 1000 mL via INTRAVENOUS

## 2023-11-22 MED ORDER — KETOROLAC TROMETHAMINE 15 MG/ML IJ SOLN
15.0000 mg | Freq: Once | INTRAMUSCULAR | Status: AC
  Administered 2023-11-22: 15 mg via INTRAVENOUS
  Filled 2023-11-22: qty 1

## 2023-11-22 MED ORDER — DEXAMETHASONE SODIUM PHOSPHATE 10 MG/ML IJ SOLN
10.0000 mg | Freq: Once | INTRAMUSCULAR | Status: AC
  Administered 2023-11-22: 10 mg via INTRAVENOUS
  Filled 2023-11-22: qty 1

## 2023-11-22 MED ORDER — PROCHLORPERAZINE EDISYLATE 10 MG/2ML IJ SOLN
10.0000 mg | Freq: Once | INTRAMUSCULAR | Status: AC
  Administered 2023-11-22: 10 mg via INTRAVENOUS
  Filled 2023-11-22: qty 2

## 2023-11-22 NOTE — ED Provider Notes (Signed)
 [facility] Provider Note   CSN: [id number] Arrival date & time: 11/22/23  1835     Patient presents with: Headache   [name] is a 63 y.o. female.  Patient with past history significant for migraine headaches presents the emergency department today with concerns of headache.  Reports head pain for the last 3 days concentrated primarily to the right side.  She also endorses some sinus pressure over bilateral eyebrows and over the left cheek but denies any productive cough, sneezing, runny nose, or productive clearing of her sinuses.  No reported fever, chills or bodyaches.  Does endorse mild pain to the posterior right eye but no dental pain.  She reports history of migraine headaches requiring treatment with a daily medication but has not been on medication for quite some time.  Reports her last migraine is probably about 1 month ago.  Has mild nausea but denies any vomiting or diarrhea.  Also reporting some photosensitivity.   Headache      Prior to Admission medications   Medication Sig Start Date End Date Taking? Authorizing Provider  ondansetron  (ZOFRAN -ODT) 4 MG disintegrating tablet Take 1 tablet (4 mg total) by mouth every 8 (eight) hours as needed for nausea or vomiting. 07/05/23   Allwardt, Alyssa M, PA-C  oseltamivir  (TAMIFLU ) 75 MG capsule Take 1 capsule (75 mg total) by mouth 2 (two) times daily. 07/05/23   Allwardt, Alyssa M, PA-C    Allergies: Sumatriptan, Latex, and Penicillins    Review of Systems  Neurological:  Positive for headaches.  All other systems reviewed and are negative.   Updated Vital Signs BP (!) 147/63   Pulse 68   Temp 97.7 F (36.5 C)   Resp 18   Ht 5' (1.524 m)   Wt 73.5 kg   LMP 06/11/2014   SpO2 99%   BMI 31.64 kg/m   Physical Exam Vitals and nursing note reviewed.  Constitutional:      General: She is not in acute distress.    Appearance: She is well-developed.  HENT:     Head:  Normocephalic and atraumatic.      Comments: TTP overlying bilateral frontal sinuses and right maxillary sinus.   Eyes:     General: No scleral icterus.    Extraocular Movements: Extraocular movements intact.     Conjunctiva/sclera: Conjunctivae normal.     Pupils: Pupils are equal, round, and reactive to light. Pupils are equal.    Cardiovascular:     Rate and Rhythm: Normal rate and regular rhythm.     Heart sounds: No murmur heard. Pulmonary:     Effort: Pulmonary effort is normal. No respiratory distress.     Breath sounds: Normal breath sounds.  Abdominal:     Palpations: Abdomen is soft.     Tenderness: There is no abdominal tenderness.   Musculoskeletal:        General: No swelling.     Cervical back: Normal range of motion and neck supple.   Skin:    General: Skin is warm and dry.     Capillary Refill: Capillary refill takes less than 2 seconds.   Neurological:     Mental Status: She is alert.     Cranial Nerves: No cranial nerve deficit or facial asymmetry.   Psychiatric:        Mood and Affect: Mood normal.     (all labs ordered are listed, but only abnormal results are displayed) Labs Reviewed  CBC WITH DIFFERENTIAL/PLATELET - Abnormal; Notable for the following components:      Result Value   RBC 3.74 (*)    Hemoglobin 11.0 (*)    HCT 33.4 (*)    All other components within normal limits  COMPREHENSIVE METABOLIC PANEL WITH GFR    EKG: None  Radiology: CT Head Wo Contrast Result Date: 11/22/2023 CLINICAL DATA:  Headache, increasing frequency or severity, x3 days. Right orbital pain as well. EXAM: CT HEAD WITHOUT CONTRAST TECHNIQUE: Contiguous axial images were obtained from the base of the skull through the vertex without intravenous contrast. RADIATION DOSE REDUCTION: This exam was performed according to the departmental dose-optimization program which includes automated exposure control, adjustment of the mA and/or kV according to patient size and/or  use of iterative reconstruction technique. COMPARISON:  Head CT 01/27/2015. FINDINGS: Brain: No evidence of acute infarction, hemorrhage, hydrocephalus, extra-axial collection or mass lesion/mass effect. Vascular: No hyperdense vessel or unexpected calcification. Skull: Negative for fractures or focal lesions. Sinuses/Orbits: No acute finding. Other: None. IMPRESSION: No acute intracranial CT findings.  Stable exam. Electronically Signed   By: Denman Fischer [name]   On: 11/22/2023 20:10    Procedures   Medications Ordered in the ED  sodium chloride  0.9 % bolus 1,000 mL (0 mLs Intravenous Stopped 11/22/23 2133)  diphenhydrAMINE  (BENADRYL ) injection 25 mg (25 mg Intravenous Given 11/22/23 1930)  prochlorperazine  (COMPAZINE ) injection 10 mg (10 mg Intravenous Given 11/22/23 1932)  ketorolac  (TORADOL ) 15 MG/ML injection 15 mg (15 mg Intravenous Given 11/22/23 1931)  dexamethasone  (DECADRON ) injection 10 mg (10 mg Intravenous Given 11/22/23 2132)                                  Medical Decision Making Amount and/or Complexity of Data Reviewed Labs: ordered. Radiology: ordered.  Risk Prescription drug management.   This patient presents to the ED for concern of headache.  Differential diagnosis includes sinusitis, migraine headache, tension headache, SDH, stroke   Lab Tests:  I Ordered, and personally interpreted labs.  The pertinent results include: CBC with notable anemia with hemoglobin 11.0, CMP unremarkable   Imaging Studies ordered:  I ordered imaging studies including CT head I independently visualized and interpreted imaging which showed unremarkable I agree with the radiologist interpretation   Medicines ordered and prescription drug management:  I ordered medication including fluids, Compazine , Benadryl , Toradol , Decadron  for migraine cocktail Reevaluation of the patient after these medicines showed that the patient improved I have reviewed the patients home medicines and  have made adjustments as needed   Problem List / ED Course:  Patient with a history of migraine headaches presents the emergency department today with concerns of a headache.  She reports that she has having ongoing headache for the last several days without significant improvement.  Also reports simultaneous sinus pressure over the last 4 days with denies any fever, chills or bodyaches. On exam, patient has mild tenderness palpation overlying bilateral frontal sinuses as well as the right maxillary sinus.  No appreciable rhinorrhea.  Pupils are PERRL.  Some photosensitivity is seen.  No appreciable vomiting at this time. Given patient's appearance, most consistent with likely migraine headache but in differential is possible sinus infection versus other source.  Will proceed with CT imaging of the head given no recent imaging on record.  Will initiate migraine medications and reassess shortly. Patient's basic labs and CT head are thankfully reassuring.  CBC with  notable anemia with hemoglobin 11.0. On reevaluation of patient, she reports headache is improved from initial 10 out of 10 to 7 out of 10.  Will add on Decadron .  Given patient has history of complex migraines, unclear if we will have full resolution of migraine today but patient does report that she feels comfortable being discharged home with this level of improvement.  Hopefully Decadron  will provide further benefit for patient and hopeful resolution.  Encouraged patient that she will require close follow-up with her migraine/neurology clinic regarding her headaches.  Patient agreeable with this plan and verbalized understanding all return precautions.  Otherwise stable at this time for outpatient follow-up and discharged home.  Final diagnoses:  Other migraine without status migrainosus, not intractable    ED Discharge Orders     None          Concetta Dee, PA-C 11/22/23 2137    Sallyanne Creamer, DO 11/25/23 1135

## 2023-11-22 NOTE — Discharge Instructions (Signed)
 You are seen in the emergency department today for concerns of a headache.  Your labs and imaging were thankfully reassuring with no significant findings seen to explain her current symptoms.  You had good response to the migraine medication that you received here today.  I would strongly recommend that you follow-up with your headache clinic/neurologist for further management of your headaches.  For any concerns of new or worsening symptoms, please return the emergency department for evaluation.  Follow-up with your primary care provider for any other concerns.

## 2023-11-22 NOTE — ED Triage Notes (Signed)
 Pt POV c/o head pain x3 days. Reports headache and sinus pressure. Denies fever, cough. Reports posterior eye pain.   Hx of migraines.

## 2024-04-06 ENCOUNTER — Telehealth: Payer: Self-pay

## 2024-04-06 ENCOUNTER — Other Ambulatory Visit: Payer: Self-pay

## 2024-04-06 NOTE — Telephone Encounter (Signed)
 Copied from CRM #[id number]. Topic: General - Other >> Apr 06, 2024 12:33 PM Rosina BIRCH wrote: Reason for CRM: patient called wanting to know if she is still eligible for the second shingles shot and discuss a refill  for eczema cream  [phone]  Called pt and advised she can still receive shingles vaccine here in office; also before refill can be sent pt needs appt since not seen in over 1 yr. Scheduled patient for appt with PCP.

## 2024-04-07 ENCOUNTER — Encounter: Payer: Self-pay | Admitting: Obstetrics and Gynecology

## 2024-04-07 DIAGNOSIS — Z1231 Encounter for screening mammogram for malignant neoplasm of breast: Secondary | ICD-10-CM

## 2024-04-20 ENCOUNTER — Other Ambulatory Visit: Payer: Self-pay | Admitting: Obstetrics and Gynecology

## 2024-04-20 ENCOUNTER — Ambulatory Visit: Payer: Self-pay

## 2024-04-20 DIAGNOSIS — Z1231 Encounter for screening mammogram for malignant neoplasm of breast: Secondary | ICD-10-CM

## 2024-04-20 NOTE — Telephone Encounter (Signed)
 FYI Only or Action Required?: FYI only for provider: appointment scheduled on 11/11.  Patient was last seen in primary care on 07/05/2023 by Allwardt, Mardy HERO, PA-C.  Called Nurse Triage reporting Arm Pain.  Symptoms began about a month ago.  Symptoms are: gradually worsening.  Triage Disposition: See PCP When Office is Open (Within 3 Days)  Patient/caregiver understands and will follow disposition?: Yes      Copied from CRM [id number]. Topic: Clinical - Red Word Triage >> Apr 20, 2024  1:36 PM Montie POUR wrote: Red Word that prompted transfer to Nurse Triage:  Her right arm is in pain and getting worse; it has been going on for about 1 month. Hurts when she raises it and lift things.      Reason for Disposition  [1] MODERATE pain (e.g., interferes with normal activities) AND [2] present > 3 days  Answer Assessment - Initial Assessment Questions 1. ONSET: When did the pain start?     About a month  2. LOCATION: Where is the pain located?     Right arm 3. PAIN: How bad is the pain? (Scale 0-10; or none, mild, moderate, severe)     Moderate to severe  4. WORK OR EXERCISE: Has there been any recent work or exercise that involved this part of the body?     Lifts things regularly but nothing too heavy 5. CAUSE: What do you think is causing the arm pain?     Unsure  6. OTHER SYMPTOMS: Do you have any other symptoms? (e.g., neck pain, swelling, rash, fever, numbness, weakness)     No  Protocols used: Arm Pain-A-AH

## 2024-04-20 NOTE — Telephone Encounter (Signed)
 FYI pt scheduled to see Hudnell tomorrow

## 2024-04-21 ENCOUNTER — Encounter: Payer: Self-pay | Admitting: Family

## 2024-04-21 ENCOUNTER — Ambulatory Visit (INDEPENDENT_AMBULATORY_CARE_PROVIDER_SITE_OTHER): Admitting: Family

## 2024-04-21 VITALS — BP 130/88 | HR 71 | Temp 97.5°F | Ht 60.0 in | Wt 170.8 lb

## 2024-04-21 DIAGNOSIS — Z23 Encounter for immunization: Secondary | ICD-10-CM | POA: Diagnosis not present

## 2024-04-21 DIAGNOSIS — M778 Other enthesopathies, not elsewhere classified: Secondary | ICD-10-CM

## 2024-04-21 MED ORDER — NAPROXEN 500 MG PO TABS: 500.0000 mg | ORAL_TABLET | Freq: Two times a day (BID) | ORAL | 0 refills | Status: DC

## 2024-04-21 NOTE — Progress Notes (Signed)
 Patient ID: [name], female    DOB: 1960/11/19, 63 y.o.   MRN: [id number]  Chief Complaint  Patient presents with   Arm Pain    Right arm pain for 1 month.Unsure what caused pain, no injury.   Discussed the use of AI scribe software for clinical note transcription with the patient, who gave verbal consent to proceed.  History of Present Illness [name] is a 63 year old female who presents with right arm pain.  She has experienced right arm pain for over a month, affecting the entire arm and exacerbated by lifting. The pain sometimes radiates to the elbow with a sensation similar to a 'pinched nerve'. There is no burning or tingling. The pain persists even at rest and can feel like a bruise without visible bruising when moving her hand down her arm. The pain is worse with any lifting. Tenderness is not noted above the elbow. 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 She takes Motrin  for relief. The pain is becoming more constant but does not always radiate down the arm.  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 Conservative management initiated. - Use tendon strap just below elbow during the day. - Apply ice for 20 minutes multiple times daily. - Take Aleve  (naproxen ) twice daily for 5-7 days, then as needed. - Perform prescribed exercises as pain improves, handout provided. - Avoid heavy lifting and strenuous activities for a 2-3 weeks. - Follow up if no improvement for potential sports medicine referral.  Immunizations Administered flu and shingles vaccines. Discussed mild side effects and confirmed timing for second shingles dose. - Monitor for mild side effects. - Ensure completion of second shingles vaccine series.  Subjective:    Outpatient Medications Prior to Visit  Medication Sig Dispense Refill    ondansetron  (ZOFRAN -ODT) 4 MG disintegrating tablet Take 1 tablet (4 mg total) by mouth every 8 (eight) hours as needed for nausea or vomiting. (Patient not taking: Reported on 04/21/2024) 20 tablet 0   No facility-administered medications prior to visit.   Past Medical History:  Diagnosis Date   Abnormal Pap smear 06/12/1999   AMA (advanced maternal age) multigravida 35+    ANEMIA 03/23/2010   Dyspareunia 06/11/2006   Female infertility    GALLSTONES 03/23/2010   H/O constipation 06/12/1999   H/O mumps    H/O varicella    As a child    Headache(784.0) 12/18/2006   Frequently   Heart murmur    History of rubella    As a child    Hx: UTI (urinary tract infection) 04/12/2003   Migraine    Mild mitral valve prolapse    NONSPECIFIC ABNORM FIND RAD&OTH EXAM BILI TRACT 02/02/2008   OVARIAN CYST 03/23/2010   PONV (postoperative nausea and vomiting)    Ulcer 06/12/1999   Past Surgical History:  Procedure Laterality Date   CESAREAN SECTION     x 2   EXTRACORPOREAL SHOCK WAVE LITHOTRIPSY Left 12/04/2021   Procedure: EXTRACORPOREAL SHOCK WAVE LITHOTRIPSY (ESWL);  Surgeon: Devere Lonni Righter, MD;  Location: [facility];  Service: Urology;  Laterality: Left;   LAPAROSCOPIC ABLATION RENAL MASS     LAPAROSCOPIC OVARIAN CYSTECTOMY     WISDOM TOOTH EXTRACTION     Allergies  Allergen Reactions   Sumatriptan Anaphylaxis and Other (See Comments)   Latex Itching and Other (See  Comments)   Penicillins Hives, Other (See Comments) and Swelling      Objective:    Physical Exam Vitals and nursing note reviewed.  Constitutional:      Appearance: Normal appearance.  Cardiovascular:     Rate and Rhythm: Normal rate and regular rhythm.  Pulmonary:     Effort: Pulmonary effort is normal.     Breath sounds: Normal breath sounds.  Musculoskeletal:        General: Normal range of motion.     Right forearm: Tenderness (at the epicondyl, lateral & medial) present. No  swelling or bony tenderness.  Skin:    General: Skin is warm and dry.  Neurological:     Mental Status: She is alert.  Psychiatric:        Mood and Affect: Mood normal.        Behavior: Behavior normal.    BP 130/88   Pulse 71   Temp (!) 97.5 F (36.4 C) (Temporal)   Ht 5' (1.524 m)   Wt 170 lb 12.8 oz (77.5 kg)   LMP 06/11/2014   SpO2 98%   BMI 33.36 kg/m  Wt Readings from Last 3 Encounters:  04/21/24 170 lb 12.8 oz (77.5 kg)  11/22/23 162 lb (73.5 kg)  07/05/23 170 lb (77.1 kg)      Lucius Krabbe, NP

## 2024-04-28 ENCOUNTER — Ambulatory Visit: Admitting: Physician Assistant

## 2024-04-30 ENCOUNTER — Ambulatory Visit (INDEPENDENT_AMBULATORY_CARE_PROVIDER_SITE_OTHER): Admitting: Physician Assistant

## 2024-04-30 VITALS — BP 130/78 | HR 71 | Temp 98.0°F | Resp 16 | Ht 60.0 in | Wt 171.0 lb

## 2024-04-30 DIAGNOSIS — Z13220 Encounter for screening for lipoid disorders: Secondary | ICD-10-CM | POA: Diagnosis not present

## 2024-04-30 DIAGNOSIS — Z0000 Encounter for general adult medical examination without abnormal findings: Secondary | ICD-10-CM

## 2024-04-30 DIAGNOSIS — L309 Dermatitis, unspecified: Secondary | ICD-10-CM

## 2024-04-30 DIAGNOSIS — Z131 Encounter for screening for diabetes mellitus: Secondary | ICD-10-CM | POA: Diagnosis not present

## 2024-04-30 LAB — COMPREHENSIVE METABOLIC PANEL WITH GFR
ALT: 16 U/L (ref 0–35)
AST: 19 U/L (ref 0–37)
Albumin: 4.3 g/dL (ref 3.5–5.2)
Alkaline Phosphatase: 45 U/L (ref 39–117)
BUN: 18 mg/dL (ref 6–23)
CO2: 31 meq/L (ref 19–32)
Calcium: 9.5 mg/dL (ref 8.4–10.5)
Chloride: 104 meq/L (ref 96–112)
Creatinine, Ser: 0.71 mg/dL (ref 0.40–1.20)
GFR: 92.01 mL/min (ref >60.00)
Glucose, Bld: 76 mg/dL (ref 70–99)
Potassium: 4.1 meq/L (ref 3.5–5.1)
Sodium: 141 meq/L (ref 135–145)
Total Bilirubin: 0.4 mg/dL (ref 0.2–1.2)
Total Protein: 7.3 g/dL (ref 6.0–8.3)

## 2024-04-30 LAB — CBC WITH DIFFERENTIAL/PLATELET
Basophils Absolute: 0 K/uL (ref 0.0–0.1)
Basophils Relative: 0.5 % (ref 0.0–3.0)
Eosinophils Absolute: 0.1 K/uL (ref 0.0–0.7)
Eosinophils Relative: 1.9 % (ref 0.0–5.0)
HCT: 35 % — ABNORMAL LOW (ref 36.0–46.0)
Hemoglobin: 11.7 g/dL — ABNORMAL LOW (ref 12.0–15.0)
Lymphocytes Relative: 33.9 % (ref 12.0–46.0)
Lymphs Abs: 1.8 K/uL (ref 0.7–4.0)
MCHC: 33.5 g/dL (ref 30.0–36.0)
MCV: 88.9 fl (ref 78.0–100.0)
Monocytes Absolute: 0.6 K/uL (ref 0.1–1.0)
Monocytes Relative: 10.2 % (ref 3.0–12.0)
Neutro Abs: 2.9 K/uL (ref 1.4–7.7)
Neutrophils Relative %: 53.5 % (ref 43.0–77.0)
Platelets: 350 K/uL (ref 150.0–400.0)
RBC: 3.94 Mil/uL (ref 3.87–5.11)
RDW: 14.8 % (ref 11.5–15.5)
WBC: 5.4 K/uL (ref 4.0–10.5)

## 2024-04-30 LAB — LIPID PANEL
Cholesterol: 140 mg/dL (ref 0–200)
HDL: 45.2 mg/dL (ref >39.00)
LDL Cholesterol: 81 mg/dL (ref 0–99)
NonHDL: 94.83
Total CHOL/HDL Ratio: 3
Triglycerides: 70 mg/dL (ref 0.0–149.0)
VLDL: 14 mg/dL (ref 0.0–40.0)

## 2024-04-30 LAB — TSH: TSH: 1.1 u[IU]/mL (ref 0.35–5.50)

## 2024-04-30 LAB — HEMOGLOBIN A1C: Hgb A1c MFr Bld: 5.9 % (ref 4.6–6.5)

## 2024-04-30 MED ORDER — TRIAMCINOLONE ACETONIDE 0.1 % EX CREA: 1.0000 | TOPICAL_CREAM | Freq: Two times a day (BID) | CUTANEOUS | 0 refills | Status: AC

## 2024-04-30 NOTE — Progress Notes (Signed)
 Patient ID: [name], female    DOB: Oct 12, 1960, 63 y.o.   MRN: [id number]   Assessment & Plan:  Encounter for annual physical exam -     CBC with Differential/Platelet; Future -     Comprehensive metabolic panel with GFR; Future -     Lipid panel; Future -     TSH; Future -     Hemoglobin A1c; Future  Eczema, unspecified type -     Triamcinolone  Acetonide; Apply 1 Application topically 2 (two) times daily.  Dispense: 45 g; Refill: 0      Assessment and Plan Assessment & Plan Adult Wellness Visit Annual physical exam conducted. Reports improvement in arm pain after reducing usage and wearing a brace. Experienced breast engorgement likely due to excessive caffeine intake, resolved after cessation. No vaginal discharge or bleeding. Reports hot flashes and mood changes, but overall mental health is good. Regular bowel movements with some constipation. No skin concerns except for eczema. Vaccinations up to date, including shingles and flu. Pneumonia vaccine deemed low risk. Tdap vaccine valid until 2033. - Ordered blood work to assess metabolic health. - Continue with scheduled mammogram and colonoscopy. - Advised to avoid caffeine if breast engorgement recurs. - Encouraged follow-up with gynecologist for breast exam and any further concerns.  Eczema Flares intermittently, primarily on the sides of the breast. Recent flare resolved with over-the-counter cream. Triamcinolone  cream previously effective. - Prescribed triamcinolone  cream for eczema flares. - Advised to apply triamcinolone  cream to any new skin reactions, such as those from Band-Aids.  Age-appropriate screening and counseling performed today. Will check labs and call with results. Preventive measures discussed and printed in AVS for patient.   Patient Counseling: [x]   Nutrition: Stressed importance of moderation in sodium/caffeine intake, saturated fat and cholesterol, caloric balance, sufficient intake of fresh  fruits, vegetables, and fiber.  [x]   Stressed the importance of regular exercise.   []   Substance Abuse: Discussed cessation/primary prevention of tobacco, alcohol, or other drug use; driving or other dangerous activities under the influence; availability of treatment for abuse.   []   Injury prevention: Discussed safety belts, safety helmets, smoke detector, smoking near bedding or upholstery.   []   Sexuality: Discussed sexually transmitted diseases, partner selection, use of condoms, avoidance of unintended pregnancy  and contraceptive alternatives.   [x]   Dental health: Discussed importance of regular tooth brushing, flossing, and dental visits.  [x]   Health maintenance and immunizations reviewed. Please refer to Health maintenance section.          Return in about 1 year (around 04/30/2025) for physical.    Subjective:    Chief Complaint  Patient presents with   Annual Exam    Wants to be seen for a physical. No questions or concerns at this time.     HPI Discussed the use of AI scribe software for clinical note transcription with the patient, who gave verbal consent to proceed.  History of Present Illness [name] is a 63 year old female who presents for her annual physical exam.  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 The pain has improved after wearing a brace and reducing usage.  Approximately three to four weeks ago, she experienced breast pain, described as feeling engorged, which she associated with high caffeine intake. The pain resolved after she stopped consuming caffeine. She now experiences headaches when attempting to drink coffee again, though  she tolerates caffeinated tea without issue. She has a history of dense breasts and has scheduled a mammogram. Previously, she had difficulty scheduling a diagnostic bilateral exam due to referral issues, but the breast pain  has since resolved.  She experiences intermittent hot flashes, which she describes as occurring 'forever'. She also notes feeling more tired recently, going to bed earlier than usual.  She has a history of eczema, which flares up occasionally on the sides of her breast. She reports using a prescribed triamcinolone  cream in the past for eczema flares.  She has a history of gallstones but has not undergone surgery for gallbladder removal. She mentions a weight increase over the past few years, with her current weight being higher than her usual comfortable range.  No vaginal discharge or bleeding.  She experiences regular constipation but notes it is improving. She has scheduled a colonoscopy and an eye appointment.     Past Medical History:  Diagnosis Date   Abnormal Pap smear 06/12/1999   AMA (advanced maternal age) multigravida 35+    ANEMIA 03/23/2010   Dyspareunia 06/11/2006   Female infertility    GALLSTONES 03/23/2010   H/O constipation 06/12/1999   H/O mumps    H/O varicella    As a child    Headache(784.0) 12/18/2006   Frequently   Heart murmur    History of rubella    As a child    Hx: UTI (urinary tract infection) 04/12/2003   Migraine    Mild mitral valve prolapse    NONSPECIFIC ABNORM FIND RAD&OTH EXAM BILI TRACT 02/02/2008   OVARIAN CYST 03/23/2010   PONV (postoperative nausea and vomiting)    Ulcer 06/12/1999    Past Surgical History:  Procedure Laterality Date   CESAREAN SECTION     x 2   EXTRACORPOREAL SHOCK WAVE LITHOTRIPSY Left 12/04/2021   Procedure: EXTRACORPOREAL SHOCK WAVE LITHOTRIPSY (ESWL);  Surgeon: Devere Lonni Righter, MD;  Location: [facility];  Service: Urology;  Laterality: Left;   LAPAROSCOPIC ABLATION RENAL MASS     LAPAROSCOPIC OVARIAN CYSTECTOMY     WISDOM TOOTH EXTRACTION      Family History  Problem Relation Age of Onset   Hypertension Mother    Heart disease Father    Hypertension Maternal Aunt    Asthma  Maternal Aunt    Diabetes Maternal Aunt    Colon cancer Neg Hx    Esophageal cancer Neg Hx    Stomach cancer Neg Hx    Rectal cancer Neg Hx    Breast cancer Neg Hx     Social History   Tobacco Use   Smoking status: Never   Smokeless tobacco: Never  Vaping Use   Vaping status: Never Used  Substance Use Topics   Alcohol use: No    Alcohol/week: 0.0 standard drinks of alcohol   Drug use: No     Allergies  Allergen Reactions   Sumatriptan Anaphylaxis and Other (See Comments)   Penicillins Hives   Latex Itching    Review of Systems NEGATIVE UNLESS OTHERWISE INDICATED IN HPI      Objective:     BP 130/78   Pulse 71   Temp 98 F (36.7 C) (Temporal)   Resp 16   Ht 5' (1.524 m)   Wt 171 lb (77.6 kg)   LMP 06/11/2014   SpO2 98%   BMI 33.40 kg/m   Wt Readings from Last 3 Encounters:  04/30/24 171 lb (77.6 kg)  04/21/24 170 lb 12.8  oz (77.5 kg)  11/22/23 162 lb (73.5 kg)    BP Readings from Last 3 Encounters:  04/30/24 130/78  04/21/24 130/88  11/22/23 (!) 145/68     Physical Exam Vitals and nursing note reviewed.  Constitutional:      Appearance: Normal appearance. She is obese. She is not toxic-appearing.  HENT:     Head: Normocephalic and atraumatic.     Right Ear: Tympanic membrane, ear canal and external ear normal.     Left Ear: Tympanic membrane, ear canal and external ear normal.     Nose: Nose normal.     Mouth/Throat:     Mouth: Mucous membranes are moist.  Eyes:     Extraocular Movements: Extraocular movements intact.     Conjunctiva/sclera: Conjunctivae normal.     Pupils: Pupils are equal, round, and reactive to light.  Cardiovascular:     Rate and Rhythm: Normal rate and regular rhythm.     Pulses: Normal pulses.     Heart sounds: Normal heart sounds.  Pulmonary:     Effort: Pulmonary effort is normal.     Breath sounds: Normal breath sounds.  Abdominal:     General: Abdomen is flat. Bowel sounds are normal.     Palpations:  Abdomen is soft.  Musculoskeletal:        General: Normal range of motion.     Cervical back: Normal range of motion and neck supple.  Skin:    General: Skin is warm and dry.  Neurological:     General: No focal deficit present.     Mental Status: She is alert and oriented to person, place, and time.  Psychiatric:        Mood and Affect: Mood normal.        Behavior: Behavior normal.        Thought Content: Thought content normal.        Judgment: Judgment normal.             Aneesah Hernan M Mirriam Vadala, PA-C

## 2024-04-30 NOTE — Patient Instructions (Signed)
 Great to see you! Hope your holidays are wonderful!

## 2024-05-04 ENCOUNTER — Ambulatory Visit: Payer: Self-pay | Admitting: Physician Assistant

## 2024-05-04 DIAGNOSIS — H52222 Regular astigmatism, left eye: Secondary | ICD-10-CM | POA: Diagnosis not present

## 2024-05-04 DIAGNOSIS — H0288B Meibomian gland dysfunction left eye, upper and lower eyelids: Secondary | ICD-10-CM | POA: Diagnosis not present

## 2024-05-04 DIAGNOSIS — H2513 Age-related nuclear cataract, bilateral: Secondary | ICD-10-CM | POA: Diagnosis not present

## 2024-05-04 DIAGNOSIS — H5213 Myopia, bilateral: Secondary | ICD-10-CM | POA: Diagnosis not present

## 2024-05-04 DIAGNOSIS — H524 Presbyopia: Secondary | ICD-10-CM | POA: Diagnosis not present

## 2024-05-04 DIAGNOSIS — H0288A Meibomian gland dysfunction right eye, upper and lower eyelids: Secondary | ICD-10-CM | POA: Diagnosis not present

## 2024-05-04 DIAGNOSIS — H35363 Drusen (degenerative) of macula, bilateral: Secondary | ICD-10-CM | POA: Diagnosis not present

## 2024-05-11 ENCOUNTER — Ambulatory Visit

## 2024-05-11 VITALS — Ht 60.0 in | Wt 172.0 lb

## 2024-05-11 DIAGNOSIS — Z1211 Encounter for screening for malignant neoplasm of colon: Secondary | ICD-10-CM

## 2024-05-11 MED ORDER — NA SULFATE-K SULFATE-MG SULF 17.5-3.13-1.6 GM/177ML PO SOLN: 1.0000 | Freq: Once | ORAL | 0 refills | Status: AC

## 2024-05-11 NOTE — Progress Notes (Signed)
 No egg or soy allergy known to patient  No issues known to pt with past sedation with any surgeries or procedures Patient denies ever being told they had issues or difficulty with intubation  No FH of Malignant Hyperthermia Pt is not on diet pills Pt is not on  home 02  Pt is not on blood thinners  Pt has intermittent issues with constipation; no meds  No A fib or A flutter Have any cardiac testing pending--No Pt can ambulate  Pt denies use of chewing tobacco Discussed diabetic I weight loss medication holds Discussed NSAID holds Checked BMI Pt instructed to use Single[employer] or GoodRx for a price reduction on prep  Patient's chart reviewed by Norleen Schillings CNRA prior to previsit and patient appropriate for the [facility].  Pre visit completed and red dot placed by patient's name on their procedure day (on provider's schedule).

## 2024-05-18 ENCOUNTER — Encounter: Payer: Self-pay | Admitting: Gastroenterology

## 2024-05-18 ENCOUNTER — Inpatient Hospital Stay
Admission: RE | Admit: 2024-05-18 | Discharge: 2024-05-18 | Attending: Obstetrics and Gynecology | Admitting: Obstetrics and Gynecology

## 2024-05-18 DIAGNOSIS — Z1231 Encounter for screening mammogram for malignant neoplasm of breast: Secondary | ICD-10-CM

## 2024-05-25 ENCOUNTER — Encounter: Payer: Self-pay | Admitting: Gastroenterology

## 2024-05-25 ENCOUNTER — Ambulatory Visit: Admitting: Gastroenterology

## 2024-05-25 VITALS — BP 122/68 | HR 74 | Temp 98.0°F | Resp 17 | Ht 60.0 in | Wt 172.0 lb

## 2024-05-25 DIAGNOSIS — K5730 Diverticulosis of large intestine without perforation or abscess without bleeding: Secondary | ICD-10-CM

## 2024-05-25 DIAGNOSIS — K648 Other hemorrhoids: Secondary | ICD-10-CM | POA: Diagnosis not present

## 2024-05-25 DIAGNOSIS — Z1211 Encounter for screening for malignant neoplasm of colon: Secondary | ICD-10-CM

## 2024-05-25 DIAGNOSIS — K635 Polyp of colon: Secondary | ICD-10-CM | POA: Diagnosis not present

## 2024-05-25 DIAGNOSIS — K641 Second degree hemorrhoids: Secondary | ICD-10-CM

## 2024-05-25 DIAGNOSIS — D125 Benign neoplasm of sigmoid colon: Secondary | ICD-10-CM

## 2024-05-25 MED ORDER — SODIUM CHLORIDE 0.9 % IV SOLN: 500.0000 mL | Freq: Once | INTRAVENOUS | Status: DC

## 2024-05-25 NOTE — Progress Notes (Signed)
 GASTROENTEROLOGY PROCEDURE H&P NOTE   Primary Care Physician: Allwardt, Mardy HERO, PA-C    Reason for Procedure:  Colon Cancer screening  Plan:    Colonoscopy  Patient is appropriate for endoscopic procedure(s) in the ambulatory ([facility]) setting.  The nature of the procedure, as well as the risks, benefits, and alternatives were carefully and thoroughly reviewed with the patient. Ample time for discussion and questions allowed. The patient understood, was satisfied, and agreed to proceed. I personally addressed all patient questions and concerns.     HPI: [name] is a 63 y.o. female who presents for colonoscopy for routine Colon Cancer screening.  No active GI symptoms.  No known family history of colon cancer or related malignancy.  Patient is otherwise without complaints or active issues today.  Last colonoscopy was 11/2013 and normal.  Past Medical History:  Diagnosis Date   Abnormal Pap smear 06/12/1999   AMA (advanced maternal age) multigravida 35+    ANEMIA 03/23/2010   Dyspareunia 06/11/2006   Female infertility    GALLSTONES 03/23/2010   H/O constipation 06/12/1999   H/O mumps    H/O varicella    As a child    Headache(784.0) 12/18/2006   Frequently   Heart murmur    History of rubella    As a child    Hx: UTI (urinary tract infection) 04/12/2003   Migraine    Mild mitral valve prolapse    NONSPECIFIC ABNORM FIND RAD&OTH EXAM BILI TRACT 02/02/2008   OVARIAN CYST 03/23/2010   PONV (postoperative nausea and vomiting)    Ulcer 06/12/1999    Past Surgical History:  Procedure Laterality Date   CESAREAN SECTION     x 2   EXTRACORPOREAL SHOCK WAVE LITHOTRIPSY Left 12/04/2021   Procedure: EXTRACORPOREAL SHOCK WAVE LITHOTRIPSY (ESWL);  Surgeon: Devere Lonni Righter, MD;  Location: [facility];  Service: Urology;  Laterality: Left;   LAPAROSCOPIC ABLATION RENAL MASS     LAPAROSCOPIC OVARIAN CYSTECTOMY     WISDOM TOOTH EXTRACTION       Prior to Admission medications  Medication Sig Start Date End Date Taking? Authorizing Provider  triamcinolone  cream (KENALOG ) 0.1 % Apply 1 Application topically 2 (two) times daily. 04/30/24   Allwardt, Alyssa M, PA-C    Current Outpatient Medications  Medication Sig Dispense Refill   triamcinolone  cream (KENALOG ) 0.1 % Apply 1 Application topically 2 (two) times daily. 45 g 0   Current Facility-Administered Medications  Medication Dose Route Frequency Provider Last Rate Last Admin   0.9 %  sodium chloride  infusion  500 mL Intravenous Once Kirbie Stodghill V, DO        Allergies as of 05/25/2024 - Review Complete 05/25/2024  Allergen Reaction Noted   Sumatriptan Anaphylaxis and Other (See Comments) 02/02/2008   Penicillins Hives 02/02/2008   Latex Itching 02/02/2008    Family History  Problem Relation Age of Onset   Hypertension Mother    Heart disease Father    Hypertension Maternal Aunt    Asthma Maternal Aunt    Diabetes Maternal Aunt    Colon cancer Neg Hx    Esophageal cancer Neg Hx    Stomach cancer Neg Hx    Rectal cancer Neg Hx    Breast cancer Neg Hx     Social History   Socioeconomic History   Marital status: Divorced    Spouse name: Not on file   Number of children: 2   Years of education: Not on file  Highest education level: Not on file  Occupational History    Employer: [employer]  Tobacco Use   Smoking status: Never   Smokeless tobacco: Never  Vaping Use   Vaping status: Never Used  Substance and Sexual Activity   Alcohol use: No    Alcohol/week: 0.0 standard drinks of alcohol   Drug use: No   Sexual activity: Not on file  Other Topics Concern   Not on file  Social History Narrative   Not on file   Social Drivers of Health   Tobacco Use: Low Risk (05/11/2024)   Patient History    Smoking Tobacco Use: Never    Smokeless Tobacco Use: Never    Passive Exposure: Not on file  Financial Resource Strain: Not on file  Food  Insecurity: Not on file  Transportation Needs: Not on file  Physical Activity: Not on file  Stress: Not on file  Social Connections: Not on file  Intimate Partner Violence: Not on file  Depression (PHQ2-9): Low Risk (04/30/2024)   Depression (PHQ2-9)    PHQ-2 Score: 0  Alcohol Screen: Not on file  Housing: Not on file  Utilities: Not on file  Health Literacy: Not on file    Physical Exam: Vital signs in last 24 hours: @BP  130/83   Pulse 84   Temp 98 F (36.7 C)   Ht 5' (1.524 m)   Wt 172 lb (78 kg)   LMP 06/11/2014   SpO2 98%   BMI 33.59 kg/m  GEN: NAD EYE: Sclerae anicteric ENT: MMM CV: Non-tachycardic Pulm: CTA b/l GI: Soft, NT/ND NEURO:  Alert & Oriented x 3   Adrienne Flatter, DO [facility]   05/25/2024 3:17 PM

## 2024-05-25 NOTE — Progress Notes (Signed)
 Called to room to assist during endoscopic procedure.  Patient ID and intended procedure confirmed with present staff. Received instructions for my participation in the procedure from the performing physician.

## 2024-05-25 NOTE — Patient Instructions (Signed)
-  Handout on polyps and diverticulosis provided -await pathology results -repeat colonoscopy for surveillance recommended. Date to be determined when pathology result become available   -Continue present medications   YOU HAD AN ENDOSCOPIC PROCEDURE TODAY AT THE [facility]:   Refer to the procedure report that was given to you for any specific questions about what was found during the examination.  If the procedure report does not answer your questions, please call your gastroenterologist to clarify.  If you requested that your care partner not be given the details of your procedure findings, then the procedure report has been included in a sealed envelope for you to review at your convenience later.  YOU SHOULD EXPECT: Some feelings of bloating in the abdomen. Passage of more gas than usual.  Walking can help get rid of the air that was put into your GI tract during the procedure and reduce the bloating. If you had a lower endoscopy (such as a colonoscopy or flexible sigmoidoscopy) you may notice spotting of blood in your stool or on the toilet paper. If you underwent a bowel prep for your procedure, you may not have a normal bowel movement for a few days.  Please Note:  You might notice some irritation and congestion in your nose or some drainage.  This is from the oxygen used during your procedure.  There is no need for concern and it should clear up in a day or so.  SYMPTOMS TO REPORT IMMEDIATELY:  Following lower endoscopy (colonoscopy or flexible sigmoidoscopy):  Excessive amounts of blood in the stool  Significant tenderness or worsening of abdominal pains  Swelling of the abdomen that is new, acute  Fever of 100F or higher   For urgent or emergent issues, a gastroenterologist can be reached at any hour by calling [phone]. Do not use MyChart messaging for urgent concerns.    DIET:  We do recommend a small meal at first, but then you may proceed to your regular  diet.  Drink plenty of fluids but you should avoid alcoholic beverages for 24 hours.  ACTIVITY:  You should plan to take it easy for the rest of today and you should NOT DRIVE or use heavy machinery until tomorrow (because of the sedation medicines used during the test).    FOLLOW UP: Our staff will call the number listed on your records the next business day following your procedure.  We will call around 7:15- 8:00 am to check on you and address any questions or concerns that you may have regarding the information given to you following your procedure. If we do not reach you, we will leave a message.     If any biopsies were taken you will be contacted by phone or by letter within the next 1-3 weeks.  Please call us at [phone] if you have not heard about the biopsies in 3 weeks.    SIGNATURES/CONFIDENTIALITY: You and/or your care partner have signed paperwork which will be entered into your electronic medical record.  These signatures attest to the fact that that the information above on your After Visit Summary has been reviewed and is understood.  Full responsibility of the confidentiality of this discharge information lies with you and/or your care-partner.   

## 2024-05-25 NOTE — Progress Notes (Signed)
 Pt's states no medical or surgical changes since previsit or office visit.

## 2024-05-25 NOTE — Op Note (Signed)
 [facility] Patient Name: [name] Procedure Date: 05/25/2024 3:14 PM MRN: [id number] Endoscopist: Sandor Flatter , MD, [id number] Age: 63 Referring MD:  Date of Birth: 04/11/1961 Gender: Female Account #: [id number] Procedure:                Colonoscopy Indications:              Screening for colorectal malignant neoplasm (last                            colonoscopy was 10 years ago)                           Last colonoscopy was 11/2013 and normal. Medicines:                Monitored Anesthesia Care Procedure:                Pre-Anesthesia Assessment:                           - Prior to the procedure, a History and Physical                            was performed, and patient medications and                            allergies were reviewed. The patient's tolerance of                            previous anesthesia was also reviewed. The risks                            and benefits of the procedure and the sedation                            options and risks were discussed with the patient.                            All questions were answered, and informed consent                            was obtained. Prior Anticoagulants: The patient has                            taken no anticoagulant or antiplatelet agents. ASA                            Grade Assessment: II - A patient with mild systemic                            disease. After reviewing the risks and benefits,                            the patient was deemed in satisfactory condition to  undergo the procedure.                           After obtaining informed consent, the colonoscope                            was passed under direct vision. Throughout the                            procedure, the patient's blood pressure, pulse, and                            oxygen saturations were monitored continuously. The                            CF [id number] #[id number] was introduced  through the anus                            and advanced to the the cecum, identified by                            appendiceal orifice and ileocecal valve. The                            ileocecal valve, appendiceal orifice, and rectum                            were photographed. Scope In: 3:25:33 PM Scope Out: 3:40:26 PM Scope Withdrawal Time: 0 hours 10 minutes 56 seconds  Total Procedure Duration: 0 hours 14 minutes 53 seconds  Findings:                 The perianal and digital rectal examinations were                            normal.                           A 3 mm polyp was found in the sigmoid colon. The                            polyp was sessile. The polyp was removed with a                            cold snare. Resection and retrieval were complete.                            Estimated blood loss was minimal.                           Multiple small-mouthed diverticula were found in                            the sigmoid colon and descending colon.  Non-bleeding internal hemorrhoids were found during                            retroflexion. The hemorrhoids were small. Complications:            No immediate complications. Estimated Blood Loss:     Estimated blood loss was minimal. Impression:               - One 3 mm polyp in the sigmoid colon, removed with                            a cold snare. Resected and retrieved.                           - Diverticulosis in the sigmoid colon and in the                            descending colon.                           - Non-bleeding internal hemorrhoids. Recommendation:           - Patient has a contact number available for                            emergencies. The signs and symptoms of potential                            delayed complications were discussed with the                            patient. Return to normal activities tomorrow.                            Written discharge instructions  were provided to the                            patient.                           - Resume previous diet.                           - Continue present medications.                           - Await pathology results.                           - Repeat colonoscopy in 5-10 years for surveillance                            based on pathology results.                           - Return to GI clinic PRN. Sandor Flatter, MD 05/25/2024 3:44:58 PM

## 2024-05-25 NOTE — Progress Notes (Signed)
 Vss nad trans to pacu

## 2024-05-26 ENCOUNTER — Ambulatory Visit
Admission: RE | Admit: 2024-05-26 | Discharge: 2024-05-26 | Disposition: A | Source: Ambulatory Visit | Attending: Obstetrics and Gynecology | Admitting: Obstetrics and Gynecology

## 2024-05-26 ENCOUNTER — Telehealth: Payer: Self-pay | Admitting: *Deleted

## 2024-05-26 ENCOUNTER — Other Ambulatory Visit: Payer: Self-pay | Admitting: Obstetrics and Gynecology

## 2024-05-26 DIAGNOSIS — Z1231 Encounter for screening mammogram for malignant neoplasm of breast: Secondary | ICD-10-CM

## 2024-05-26 NOTE — Telephone Encounter (Signed)
°  Follow up Call-     05/25/2024    2:57 PM  Call back number  Post procedure Call Back phone  # [phone]  Permission to leave phone message Yes     Patient questions:  Do you have a fever, pain , or abdominal swelling? No. Pain Score  0 *  Have you tolerated food without any problems? Yes.    Have you been able to return to your normal activities? Yes.    Do you have any questions about your discharge instructions: Diet   No. Medications  No. Follow up visit  No.  Do you have questions or concerns about your Care? No.  Actions: * If pain score is 4 or above: No action needed, pain <4.

## 2024-05-28 LAB — SURGICAL PATHOLOGY

## 2024-05-29 ENCOUNTER — Ambulatory Visit: Payer: Self-pay | Admitting: Gastroenterology

## 2025-05-03 ENCOUNTER — Encounter: Admitting: Physician Assistant
